# Patient Record
Sex: Male | Born: 1945 | Race: White | Hispanic: No | Marital: Single | Smoking: Never smoker
Health system: Southern US, Community
[De-identification: ages and names within clinical notes are randomized; demographics above are authoritative.]

## PROBLEM LIST (undated history)

## (undated) DIAGNOSIS — A048 Other specified bacterial intestinal infections: Secondary | ICD-10-CM

## (undated) DIAGNOSIS — I10 Essential (primary) hypertension: Secondary | ICD-10-CM

## (undated) DIAGNOSIS — M810 Age-related osteoporosis without current pathological fracture: Secondary | ICD-10-CM

## (undated) DIAGNOSIS — K2270 Barrett's esophagus without dysplasia: Secondary | ICD-10-CM

## (undated) DIAGNOSIS — G8929 Other chronic pain: Secondary | ICD-10-CM

## (undated) DIAGNOSIS — R109 Unspecified abdominal pain: Secondary | ICD-10-CM

## (undated) DIAGNOSIS — K449 Diaphragmatic hernia without obstruction or gangrene: Secondary | ICD-10-CM

## (undated) DIAGNOSIS — K219 Gastro-esophageal reflux disease without esophagitis: Secondary | ICD-10-CM

## (undated) DIAGNOSIS — N200 Calculus of kidney: Secondary | ICD-10-CM

## (undated) DIAGNOSIS — I639 Cerebral infarction, unspecified: Secondary | ICD-10-CM

## (undated) DIAGNOSIS — C159 Malignant neoplasm of esophagus, unspecified: Secondary | ICD-10-CM

## (undated) HISTORY — DX: Gastro-esophageal reflux disease without esophagitis: K21.9

## (undated) HISTORY — DX: Other specified bacterial intestinal infections: A04.8

## (undated) HISTORY — PX: COLONOSCOPY: SHX174

## (undated) HISTORY — DX: Cerebral infarction, unspecified: I63.9

## (undated) HISTORY — DX: Essential (primary) hypertension: I10

## (undated) HISTORY — DX: Malignant neoplasm of esophagus, unspecified: C15.9

## (undated) HISTORY — DX: Age-related osteoporosis without current pathological fracture: M81.0

## (undated) HISTORY — DX: Diaphragmatic hernia without obstruction or gangrene: K44.9

## (undated) HISTORY — DX: Barrett's esophagus without dysplasia: K22.70

## (undated) HISTORY — PX: TONSILLECTOMY: SUR1361

## (undated) HISTORY — PX: CHOLECYSTECTOMY: SHX55

## (undated) HISTORY — DX: Calculus of kidney: N20.0

---

## 2014-04-13 ENCOUNTER — Encounter: Payer: Self-pay | Admitting: Internal Medicine

## 2014-04-15 ENCOUNTER — Encounter: Payer: Self-pay | Admitting: *Deleted

## 2014-04-18 ENCOUNTER — Ambulatory Visit (INDEPENDENT_AMBULATORY_CARE_PROVIDER_SITE_OTHER): Payer: Medicare Other | Admitting: Internal Medicine

## 2014-04-18 ENCOUNTER — Encounter: Payer: Self-pay | Admitting: Internal Medicine

## 2014-04-18 ENCOUNTER — Other Ambulatory Visit (INDEPENDENT_AMBULATORY_CARE_PROVIDER_SITE_OTHER): Payer: Medicare Other

## 2014-04-18 VITALS — BP 136/78 | HR 76 | Ht 64.0 in | Wt 158.4 lb

## 2014-04-18 DIAGNOSIS — R1012 Left upper quadrant pain: Secondary | ICD-10-CM

## 2014-04-18 DIAGNOSIS — R1013 Epigastric pain: Secondary | ICD-10-CM

## 2014-04-18 DIAGNOSIS — K5909 Other constipation: Secondary | ICD-10-CM

## 2014-04-18 LAB — COMPREHENSIVE METABOLIC PANEL
ALT: 21 U/L (ref 0–53)
AST: 19 U/L (ref 0–37)
Albumin: 4.4 g/dL (ref 3.5–5.2)
Alkaline Phosphatase: 96 U/L (ref 39–117)
BILIRUBIN TOTAL: 0.4 mg/dL (ref 0.2–1.2)
BUN: 14 mg/dL (ref 6–23)
CO2: 30 meq/L (ref 19–32)
Calcium: 9.3 mg/dL (ref 8.4–10.5)
Chloride: 104 mEq/L (ref 96–112)
Creatinine, Ser: 0.97 mg/dL (ref 0.40–1.50)
GFR: 81.65 mL/min (ref >60.00)
Glucose, Bld: 108 mg/dL — ABNORMAL HIGH (ref 70–99)
Potassium: 4.8 mEq/L (ref 3.5–5.1)
SODIUM: 139 meq/L (ref 135–145)
TOTAL PROTEIN: 6.9 g/dL (ref 6.0–8.3)

## 2014-04-18 LAB — CBC WITH DIFFERENTIAL/PLATELET
Basophils Absolute: 0 10*3/uL (ref 0.0–0.1)
Basophils Relative: 0.2 % (ref 0.0–3.0)
EOS ABS: 0.1 10*3/uL (ref 0.0–0.7)
EOS PCT: 1.4 % (ref 0.0–5.0)
HCT: 42.8 % (ref 39.0–52.0)
Hemoglobin: 14.3 g/dL (ref 13.0–17.0)
LYMPHS ABS: 1.4 10*3/uL (ref 0.7–4.0)
LYMPHS PCT: 23.9 % (ref 12.0–46.0)
MCHC: 33.5 g/dL (ref 30.0–36.0)
MCV: 83.3 fl (ref 78.0–100.0)
MONO ABS: 0.5 10*3/uL (ref 0.1–1.0)
Monocytes Relative: 8.2 % (ref 3.0–12.0)
Neutro Abs: 3.8 10*3/uL (ref 1.4–7.7)
Neutrophils Relative %: 66.3 % (ref 43.0–77.0)
PLATELETS: 218 10*3/uL (ref 150.0–400.0)
RBC: 5.13 Mil/uL (ref 4.22–5.81)
RDW: 13.8 % (ref 11.5–15.5)
WBC: 5.7 10*3/uL (ref 4.0–10.5)

## 2014-04-18 LAB — IGA: IgA: 154 mg/dL (ref 68–378)

## 2014-04-18 LAB — TSH: TSH: 2.39 u[IU]/mL (ref 0.35–4.50)

## 2014-04-18 LAB — LIPASE: LIPASE: 20 U/L (ref 11.0–59.0)

## 2014-04-18 MED ORDER — LINACLOTIDE 290 MCG PO CAPS: 290.0000 ug | ORAL_CAPSULE | Freq: Every day | ORAL | Status: AC

## 2014-04-18 NOTE — Progress Notes (Signed)
Patient ID: Douglas Odonnell, male   DOB: Aug 24, 1945, 69 y.o.   MRN: [id number] HPI: Douglas Odonnell is a 69 year old male with past medical history of GERD, hypertension, osteoporosis and kidney stones who is seen in consultation at the request of Dr. Melina Copa to evaluate chronic epigastric and left upper quadrant pain. He is here today with his wife. He reports he developed fairly acute upper and left upper abdominal pain a little over 2 years ago. He reports this pain is a constant pain which he describes as a "pressure or burning or like a fist". This gets worse with certain foods like carbonated sodas and lettuce. Better with pain medication. Associated with bloating. Some mild nausea but no vomiting. Had cholecystitis with cholecystectomy in April 2014 but this did not affect the pain in any way. He seems to remember having severe heartburn associated with this pressure type pain. The heartburn went away with PPI but the pain never resolved. He reports a good appetite with no recent weight loss. No early satiety. No dysphagia or odynophagia. He does report issues with constipation associated with his oxycodone. He is using oxycodone every 4 hours on a daily basis for this pain. Without the medication he says the pain is intense. He previously tried lubiprostone but is now taking Linzess 145 g daily. With this he is still having bowel movements every 3-4 days. He denies blood in his stool or melena. Without laxatives he was going less than once per week.  Prior upper endoscopy and colonoscopy performed for similar symptoms in May 2014.  Past Medical History  Diagnosis Date  . Hypertension   . GERD (gastroesophageal reflux disease)   . Senile osteoporosis   . Hiatal hernia   . Renal calculus     Past Surgical History  Procedure Laterality Date  . Tonsillectomy    . Cholecystectomy      No outpatient prescriptions prior to visit.   No facility-administered medications prior to visit.    No  Known Allergies  Family History  Problem Relation Age of Onset  . Diabetes Father   . Heart disease Father   . Prostate cancer Father   . Diabetes Mother   . Kidney failure Mother   . Gout Mother   . Diabetes Sister     twin    History  Substance Use Topics  . Smoking status: Never Smoker   . Smokeless tobacco: Never Used  . Alcohol Use: No    ROS: As per history of present illness, otherwise negative  BP 136/78 mmHg  Pulse 76  Ht 5\' 4"  (1.626 m)  Wt 158 lb 6.4 oz (71.85 kg)  BMI 27.18 kg/m2 Constitutional: Well-developed and well-nourished. No distress. HEENT: Normocephalic and atraumatic. Oropharynx is clear and moist. No oropharyngeal exudate. Conjunctivae are normal.  No scleral icterus. Upper dentures Neck: Neck supple. Trachea midline. Cardiovascular: Normal rate, regular rhythm and intact distal pulses. No M/R/G Pulmonary/chest: Effort normal and breath sounds normal. No wheezing, rales or rhonchi. Abdominal: Soft, epigastric tenderness without rebound or guarding, nondistended. Bowel sounds active throughout. There are no masses palpable. No hepatosplenomegaly. Extremities: no clubbing, cyanosis, or edema Lymphadenopathy: No cervical adenopathy noted. Neurological: Alert and oriented to person place and time. Skin: Skin is warm and dry. No rashes noted. Psychiatric: Normal mood and affect. Behavior is normal.  Upper endoscopy - 08/01/2012 -- moderate bile gastritis Colonoscopy - 08/18/2012 -- normal colonoscopy, follow-up recommended in 10 years for screening  CT abdomen and pelvis with contrast --  04/07/2014 -- no acute findings in the abdomen or pelvis, small hiatal hernia, 5 mm nonobstructing right renal calculus  ASSESSMENT/PLAN: 69 year old male with past medical history of GERD, hypertension, osteoporosis and kidney stones who is seen in consultation at the request of Dr. Melina Copa to evaluate chronic epigastric and left upper quadrant pain.   1.  Epigastric/LUQ pain -- this pain is chronic at this point. Previous evaluation has been unremarkable including recent CT scan. He is taking chronic narcotics for this pain, which is definitely exacerbating constipation. It also may be causing an element of gastroparesis. There was evidence of "bile gastritis" at time of endoscopy but I do not see biopsies. I recommended repeat upper endoscopy given persistent and even worsening of this epigastric and left upper quadrant pain. Rule out ulcers, rule out H. pylori. He will continue Nexium 40 mg daily. Check CBC, CMP, celiac panel, lipase and TSH. If endoscopy completely unrevealing, could consider other treatment for chronic upper abdominal pain such as with gabapentin or Lyrica. Either of those 2 medications would likely be preferable to chronic narcotics  2. Narcotic-induced constipation -- increase Linzess to 290 g daily  Cc referring provider, Dr. Melina Copa

## 2014-04-18 NOTE — Patient Instructions (Signed)
You have been scheduled for an endoscopy. Please follow written instructions given to you at your visit today. If you use inhalers (even only as needed), please bring them with you on the day of your procedure. Your physician has requested that you go to www.startemmi.com and enter the access code given to you at your visit today. This web site gives a general overview about your procedure. However, you should still follow specific instructions given to you by our office regarding your preparation for the procedure.  Your physician has requested that you go to the basement for the following lab work before leaving today: CBC, CMP, IgA, TtG, lipase, TSH  We have sent the following medications to your pharmacy for you to pick up at your convenience: Linzess 290 mcg daily  CC:Dr Melina Copa

## 2014-04-19 ENCOUNTER — Encounter: Payer: Self-pay | Admitting: Internal Medicine

## 2014-04-19 ENCOUNTER — Ambulatory Visit (AMBULATORY_SURGERY_CENTER): Payer: Medicare Other | Admitting: Internal Medicine

## 2014-04-19 VITALS — BP 135/90 | HR 62 | Temp 98.1°F | Resp 29 | Ht 64.0 in | Wt 158.0 lb

## 2014-04-19 DIAGNOSIS — K229 Disease of esophagus, unspecified: Secondary | ICD-10-CM | POA: Diagnosis not present

## 2014-04-19 DIAGNOSIS — K317 Polyp of stomach and duodenum: Secondary | ICD-10-CM

## 2014-04-19 DIAGNOSIS — R1012 Left upper quadrant pain: Secondary | ICD-10-CM | POA: Diagnosis not present

## 2014-04-19 DIAGNOSIS — G8929 Other chronic pain: Secondary | ICD-10-CM

## 2014-04-19 DIAGNOSIS — B9681 Helicobacter pylori [H. pylori] as the cause of diseases classified elsewhere: Secondary | ICD-10-CM

## 2014-04-19 DIAGNOSIS — K2990 Gastroduodenitis, unspecified, without bleeding: Secondary | ICD-10-CM

## 2014-04-19 DIAGNOSIS — R1013 Epigastric pain: Secondary | ICD-10-CM | POA: Diagnosis not present

## 2014-04-19 DIAGNOSIS — K2270 Barrett's esophagus without dysplasia: Secondary | ICD-10-CM | POA: Diagnosis not present

## 2014-04-19 DIAGNOSIS — K2970 Gastritis, unspecified, without bleeding: Secondary | ICD-10-CM

## 2014-04-19 DIAGNOSIS — K2950 Unspecified chronic gastritis without bleeding: Secondary | ICD-10-CM

## 2014-04-19 LAB — TISSUE TRANSGLUTAMINASE, IGA: TISSUE TRANSGLUTAMINASE AB, IGA: 1 U/mL (ref <4)

## 2014-04-19 MED ORDER — SUCRALFATE 1 GM/10ML PO SUSP: 1.0000 g | Freq: Four times a day (QID) | ORAL | Status: DC

## 2014-04-19 MED ORDER — SODIUM CHLORIDE 0.9 % IV SOLN: 500.0000 mL | INTRAVENOUS | Status: DC

## 2014-04-19 NOTE — Op Note (Signed)
[facility]  [address] [place], [address]   ENDOSCOPY PROCEDURE REPORT  PATIENT: Douglas, Odonnell  MR#: [id number] BIRTHDATE: 19-Aug-1945 , 84  yrs. old GENDER: male ENDOSCOPIST: Jerene Bears, MD REFERRED BY:  Octavio Graves PROCEDURE DATE:  04/19/2014 PROCEDURE:  EGD w/ biopsy ASA CLASS:     Class II INDICATIONS:  epigastric pain and abdominal pain in upper left quadrant. MEDICATIONS: Monitored anesthesia care, Propofol 170 mg IV, and lidocaine 40 mg IV TOPICAL ANESTHETIC: none  DESCRIPTION OF PROCEDURE: After the risks benefits and alternatives of the procedure were thoroughly explained, informed consent was obtained.  The LB [id number] endoscope was introduced through the mouth and advanced to the second portion of the duodenum , Without limitations.  The instrument was slowly withdrawn as the mucosa was fully examined.    ESOPHAGUS: There was a 8cm segment of suspected Barrett's esophagus found 25 cm from the incisors extending to the top the gastric folds at 33 cm..  Multiple biopsies were performed using cold forceps.  Sample sent for histology.   A single 54mm nodule arising from within the Barrett's segment was located 27 cm from the incisors.  Multiple biopsies was performed using cold forceps and placed in a separate jar.  STOMACH: A 3 cm hiatal hernia was noted.   Atrophic-appearing gastritis (inflammation) was found in the gastric body and gastric antrum.  Multiple biopsies were performed using cold forceps. Multiple sessile polyps ranging between 3-16mm in size were found in the gastric antrum and gastric body.  Multiple sampling biopsies were performed using cold forceps.  DUODENUM: The duodenal mucosa showed no abnormalities in the bulb and 2nd part of the duodenum.  Retroflexed views revealed a hiatal hernia.     The scope was then withdrawn from the patient and the procedure completed.  COMPLICATIONS: There were no immediate  complications.  ENDOSCOPIC IMPRESSION: 1.   There was a 8cm segment of suspected Barrett's esophagus found 25 cm from the incisors; multiple biopsies were performed 2.   45mm nodule was located 27 cm from the incisors; multiple biopsies was performed 3.   3 cm hiatal hernia 4.   Atrophic gastritis (inflammation) was found in the gastric body and gastric antrum; multiple biopsies were performed 5.   Multiple sessile polyps ranging between 3-67mm in size were found in the gastric antrum and gastric body; multiple sampling biopsies were performed 6.   The duodenal mucosa showed no abnormalities in the bulb and 2nd part of the duodenum  RECOMMENDATIONS: 1.  Await pathology results 2.  Follow-up of helicobacter pylori status, treat if indicated 3.  Continue taking your PPI (antiacid medicine) once daily.  It is best to be taken 20-30 minutes prior to breakfast meal. 4.  Add sucralfate liquid 1 g before meals and at bedtime  eSigned:  Jerene Bears, MD 04/19/2014 11:55 AM    CC: the patient, Octavio Graves, DO  PATIENT NAME:  Douglas, Odonnell MR#: [id number]

## 2014-04-19 NOTE — Progress Notes (Signed)
Stable to RR 

## 2014-04-19 NOTE — Progress Notes (Signed)
Called to room to assist during endoscopic procedure.  Patient ID and intended procedure confirmed with present staff. Received instructions for my participation in the procedure from the performing physician.  

## 2014-04-19 NOTE — Patient Instructions (Addendum)
Impressions/recommendations:  Suspected Barrett's esophagus (handout given) Hiatal hernia (handout given) Gastritis (handout given) Polyps  Continue taking PPI (Nexium) daily. It is best taken 20-30 minutes prior to breakfast meal. Add sucralfate liquid 1 G before meals and at bedtime.  YOU HAD AN ENDOSCOPIC PROCEDURE TODAY AT [facility]: Refer to the procedure report that was given to you for any specific questions about what was found during the examination.  If the procedure report does not answer your questions, please call your gastroenterologist to clarify.  If you requested that your care partner not be given the details of your procedure findings, then the procedure report has been included in a sealed envelope for you to review at your convenience later.  YOU SHOULD EXPECT: Some feelings of bloating in the abdomen. Passage of more gas than usual.  Walking can help get rid of the air that was put into your GI tract during the procedure and reduce the bloating. If you had a lower endoscopy (such as a colonoscopy or flexible sigmoidoscopy) you may notice spotting of blood in your stool or on the toilet paper. If you underwent a bowel prep for your procedure, then you may not have a normal bowel movement for a few days.  DIET: Your first meal following the procedure should be a light meal and then it is ok to progress to your normal diet.  A half-sandwich or bowl of soup is an example of a good first meal.  Heavy or fried foods are harder to digest and may make you feel nauseous or bloated.  Likewise meals heavy in dairy and vegetables can cause extra gas to form and this can also increase the bloating.  Drink plenty of fluids but you should avoid alcoholic beverages for 24 hours.  ACTIVITY: Your care partner should take you home directly after the procedure.  You should plan to take it easy, moving slowly for the rest of the day.  You can resume normal activity the day after  the procedure however you should NOT DRIVE or use heavy machinery for 24 hours (because of the sedation medicines used during the test).    SYMPTOMS TO REPORT IMMEDIATELY: A gastroenterologist can be reached at any hour.  During normal business hours, 8:30 AM to 5:00 PM Monday through Friday, call [phone].  After hours and on weekends, please call the GI answering service at [phone] who will take a message and have the physician on call contact you.   Following upper endoscopy (EGD)  Vomiting of blood or coffee ground material  New chest pain or pain under the shoulder blades  Painful or persistently difficult swallowing  New shortness of breath  Fever of 100F or higher  Black, tarry-looking stools  FOLLOW UP: If any biopsies were taken you will be contacted by phone or by letter within the next 1-3 weeks.  Call your gastroenterologist if you have not heard about the biopsies in 3 weeks.  Our staff will call the home number listed on your records the next business day following your procedure to check on you and address any questions or concerns that you may have at that time regarding the information given to you following your procedure. This is a courtesy call and so if there is no answer at the home number and we have not heard from you through the emergency physician on call, we will assume that you have returned to your regular daily activities without incident.  SIGNATURES/CONFIDENTIALITY: You  and/or your care partner have signed paperwork which will be entered into your electronic medical record.  These signatures attest to the fact that that the information above on your After Visit Summary has been reviewed and is understood.  Full responsibility of the confidentiality of this discharge information lies with you and/or your care-partner.

## 2014-04-19 NOTE — Progress Notes (Signed)
No problems noted in the recovery room. maw 

## 2014-04-20 ENCOUNTER — Telehealth: Payer: Self-pay | Admitting: *Deleted

## 2014-04-20 NOTE — Telephone Encounter (Signed)
  Follow up Call-  Call back number 04/19/2014  Post procedure Call Back phone  # [phone]  Permission to leave phone message Yes     Patient questions:  Do you have a fever, pain , or abdominal swelling? No. Pain Score  0 *  Have you tolerated food without any problems? Yes.    Have you been able to return to your normal activities? Yes.    Do you have any questions about your discharge instructions: Diet   No. Medications  No. Follow up visit  No.  Do you have questions or concerns about your Care? No.  Actions: * If pain score is 4 or above: No action needed, pain <4.

## 2014-04-26 ENCOUNTER — Telehealth: Payer: Self-pay | Admitting: Internal Medicine

## 2014-04-26 NOTE — Telephone Encounter (Signed)
Called to discuss pathology results and plan of care No answer, left message for patient to call my office. I will try him back tomorrow if I don't hear from him sooner

## 2014-04-27 ENCOUNTER — Other Ambulatory Visit: Payer: Self-pay

## 2014-04-27 ENCOUNTER — Telehealth: Payer: Self-pay | Admitting: Internal Medicine

## 2014-04-27 DIAGNOSIS — C159 Malignant neoplasm of esophagus, unspecified: Secondary | ICD-10-CM

## 2014-04-27 MED ORDER — AMOXICILL-CLARITHRO-LANSOPRAZ PO MISC: Freq: Two times a day (BID) | ORAL | Status: DC

## 2014-04-27 MED ORDER — SUCRALFATE 1 G PO TABS: 1.0000 g | ORAL_TABLET | Freq: Three times a day (TID) | ORAL | Status: DC

## 2014-04-27 NOTE — Telephone Encounter (Signed)
Spoke with the patient's sister, Rosaria Ferries regarding pathology results from upper endoscopy 1. 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 Barrett's esophagus without dysplasia and other biopsies 3. H. pylori gastritis  --CT scan of the abdomen and pelvis was done recently, 04/07/2014 with no evidence of metastatic disease --Need CT scan of the chest at this time --[facility] for Prevpac for H. pylori treatment --Liquid Carafate too expensive, please change to Carafate tablets 1 g before meals and at bedtime and instruct patient on how to make this into a slurry --Referral to Dr. Gayleen Orem at [facility] for consideration of esophageal submucosal resection of esophageal nodule/adenocarcinoma, expect EUS to be performed at the same time. Referral is ASAP. Dr. Okey Dupre will need copies of chest CT was performed, abdominal CT scan already scanned into our records  Time provided for questions and answers, they thanked me for the call. The patient's sister, Enid Derry, will share all results with the patient. This is the patient's preference

## 2014-04-27 NOTE — Telephone Encounter (Signed)
Spoke with pts sister and gave her the following information. Script for prevpac and carafate tablets sent to pharmacy and sister instructed on how to use tablets as slurry. Pt scheduled for CT of Chest at [facility] CT 05/04/14@8 :30am, pt to arrive there at 8:15am and be NPO after 6:15am. Referral faxed to Dr. Gayleen Orem at [facility]. Sister knows to expect a call back when appt confirmed with [facility].

## 2014-05-04 ENCOUNTER — Ambulatory Visit (INDEPENDENT_AMBULATORY_CARE_PROVIDER_SITE_OTHER)
Admission: RE | Admit: 2014-05-04 | Discharge: 2014-05-04 | Disposition: A | Discharge status: Home / Self Care | Payer: Medicare Other | Source: Ambulatory Visit | Attending: Internal Medicine | Admitting: Internal Medicine

## 2014-05-04 DIAGNOSIS — C159 Malignant neoplasm of esophagus, unspecified: Secondary | ICD-10-CM

## 2014-05-04 MED ORDER — IOHEXOL 300 MG/ML  SOLN
80.0000 mL | Freq: Once | INTRAMUSCULAR | Status: AC | PRN
  Administered 2014-05-04: 80 mL via INTRAVENOUS

## 2014-05-09 ENCOUNTER — Telehealth: Payer: Self-pay | Admitting: Internal Medicine

## 2014-05-09 MED ORDER — ONDANSETRON HCL 4 MG PO TABS: 4.0000 mg | ORAL_TABLET | Freq: Four times a day (QID) | ORAL | Status: DC | PRN

## 2014-05-09 NOTE — Telephone Encounter (Signed)
Per Dr. Hilarie Fredrickson pt is good to see Dr. Adria Devon. Pt can have Zofran 4mg  1-2 every 6-8 hours prn nausea. Script sent to pharmacy.

## 2014-05-09 NOTE — Telephone Encounter (Signed)
Sister says pt has been scheduled to see Dr. Adria Devon this Thursday and to have procedure that day also. She was concerned because they were thinking they would see Dr. Lysle Rubens. States he is also having lots of nausea, not actually getting sick. Please advise.

## 2014-05-09 NOTE — Telephone Encounter (Signed)
[facility] called back and states that Dr. Lysle Rubens reviewed the records and does not feel that the pt needs an EUS. He is having the pt seen by Dr. Adria Devon and he will do the barretts procedure and either mucosal resection or RFA ablation. Still waiting on exact appointment date.

## 2014-05-09 NOTE — Telephone Encounter (Signed)
Dr. Adria Devon works closely with Dr. Lysle Rubens and is also an expert in the esophagus.  He will be in very good and capable hands with Dr. Adria Devon Zofran 4 mg 1-2 tablets every 6-8 hours as needed for nausea. Do not exceed 24 mg per 24 hours

## 2014-05-12 NOTE — Telephone Encounter (Signed)
[place] for promethazine, but this can make him very sleepy and he should not drive when using this medication

## 2014-05-12 NOTE — Telephone Encounter (Signed)
Patient's pharmacy has sent a fax stating that zofran is not covered by Medicare Part D and will cost patient $90.45. However, promethazine 25 mg #30 tablets would cost $10.00. Please advise... May patient have promethazine instead or do you prefer to stick with the zofran?

## 2014-05-12 NOTE — Telephone Encounter (Signed)
I have spoken to patient's sister (ok per HIPAA) to advise of Dr Vena Rua recommendations. She verbalizes understanding. Promethazine sent to pharmacy.

## 2014-05-12 NOTE — Addendum Note (Signed)
Addended by: Larina Bras on: 05/12/2014 02:17 PM   Modules accepted: Orders, Medications

## 2014-05-13 MED ORDER — PROMETHAZINE HCL 25 MG PO TABS: 25.0000 mg | ORAL_TABLET | Freq: Four times a day (QID) | ORAL | Status: DC | PRN

## 2014-05-13 NOTE — Addendum Note (Signed)
Addended by: Larina Bras on: 05/13/2014 04:49 PM   Modules accepted: Orders

## 2014-06-07 ENCOUNTER — Encounter: Payer: Self-pay | Admitting: Internal Medicine

## 2014-06-09 ENCOUNTER — Telehealth: Payer: Self-pay | Admitting: Internal Medicine

## 2014-06-09 NOTE — Telephone Encounter (Signed)
Pt finished taking prevpac on 05/14/14. Pts sister states that while he was taking the meds he felt better and was able to reduce the pain meds that he was taking. States that now his pain is worse in his stomach and he has had to increase the pain meds. Pts sister wants to know if pt should take more antibiotics or if he needs to see Dr. Hilarie Fredrickson. Please advise.

## 2014-06-12 NOTE — Telephone Encounter (Signed)
Would ensure PPI is BID, can use carafate 1g TIDAC and HS Would also recommend H pylori breath testing off PPI (off for at least 7 days) to confirm eradication because not Prevpak, while good is not 100% effective Then office followup

## 2014-06-14 ENCOUNTER — Other Ambulatory Visit: Payer: Self-pay

## 2014-06-14 MED ORDER — SUCRALFATE 1 G PO TABS: 1.0000 g | ORAL_TABLET | Freq: Three times a day (TID) | ORAL | Status: DC

## 2014-06-14 NOTE — Telephone Encounter (Signed)
Spoke with pts sister and she is aware, script sent to pharmacy for carafate. H pylori orders in, will call back to Endo tomorrow to schedule. Pt scheduled to see Dr. Hilarie Fredrickson 08/09/14@2 :45pm. Will call pts sister back tomorrow when breath test scheduled.

## 2014-06-15 NOTE — Telephone Encounter (Signed)
Pt scheduled for h pylori breath test at [facility] 06/28/14@7 :45am, pt to arrive there at 7:30am. Pt to be NPO after midnight and be off of PPI's for 7 days prior to test. Attempted to call pts sister and receive message that voice mail has not been set up yet. Will try again.  Spoke with pts sister and she is aware of all appts and instructions.

## 2014-06-28 ENCOUNTER — Ambulatory Visit (HOSPITAL_COMMUNITY)
Admission: RE | Admit: 2014-06-28 | Discharge: 2014-06-28 | Disposition: A | Discharge status: Home / Self Care | Payer: Medicare Other | Source: Ambulatory Visit | Attending: Internal Medicine | Admitting: Internal Medicine

## 2014-06-28 ENCOUNTER — Encounter (HOSPITAL_COMMUNITY)
Admission: RE | Disposition: A | Discharge status: Home / Self Care | Payer: Self-pay | Source: Ambulatory Visit | Attending: Internal Medicine

## 2014-06-28 ENCOUNTER — Telehealth: Payer: Self-pay

## 2014-06-28 DIAGNOSIS — I10 Essential (primary) hypertension: Secondary | ICD-10-CM | POA: Diagnosis not present

## 2014-06-28 DIAGNOSIS — R109 Unspecified abdominal pain: Secondary | ICD-10-CM | POA: Insufficient documentation

## 2014-06-28 HISTORY — PX: BREATH TEK H PYLORI: SHX5422

## 2014-06-28 SURGERY — BREATH TEST, FOR HELICOBACTER PYLORI

## 2014-06-28 NOTE — Telephone Encounter (Signed)
Can retreat x 1 to see if symptoms improve

## 2014-06-28 NOTE — Progress Notes (Signed)
   06/28/14 [place]  Referring MD Dr Kelly Splinter  Time of Last PO Intake 2100  Baseline Breath At: 0732  Pranactin Given At: 0732  Post-Dose Breath At: 0747  Sample 1 3.4%  Sample 2 3.1%  Test Negative

## 2014-06-28 NOTE — Telephone Encounter (Signed)
Please let patient and sister know that H pylori test is now negative    He can and should resume PPI   Thanks   [employer] with pts sister and she is aware of results and pt is taking PPI's. She states that he felt better when he was on treatment for h pylori and now that he has finished the meds his pain has returned and he is having to take pain meds again. Any further recommendations? Please advise.

## 2014-06-29 ENCOUNTER — Telehealth: Payer: Self-pay | Admitting: *Deleted

## 2014-06-29 ENCOUNTER — Encounter (HOSPITAL_COMMUNITY): Payer: Self-pay | Admitting: Internal Medicine

## 2014-06-29 MED ORDER — AMOXICILL-CLARITHRO-LANSOPRAZ PO MISC: Freq: Two times a day (BID) | ORAL | Status: DC

## 2014-06-29 NOTE — Telephone Encounter (Signed)
Patient's insurance does not cover Prepac. Please advise.

## 2014-06-29 NOTE — Telephone Encounter (Signed)
Spoke with pts sister and she is aware. Prescription sent to pharmacy.

## 2014-06-29 NOTE — Telephone Encounter (Signed)
He got Prevpac last time but his pharmacy sent me a note today telling me it is not covered on his plan. I will just have them to fill it and have patient call me if he has a problem with the price.

## 2014-06-29 NOTE — Telephone Encounter (Signed)
What did he take previously (he was able to get it several months ago)

## 2014-07-01 NOTE — Telephone Encounter (Signed)
I got a prior auth request for Prevpac sent from patient's pharmacy. I filled out PA form. Insurance approval has come back from [employer] stating "non formulary coverage." Coverage through 04/01/14-06/30/15.

## 2014-07-11 ENCOUNTER — Encounter: Payer: Self-pay | Admitting: *Deleted

## 2014-08-09 ENCOUNTER — Encounter: Payer: Self-pay | Admitting: Internal Medicine

## 2014-08-09 ENCOUNTER — Ambulatory Visit (INDEPENDENT_AMBULATORY_CARE_PROVIDER_SITE_OTHER): Payer: Medicare Other | Admitting: Internal Medicine

## 2014-08-09 VITALS — BP 122/64 | HR 72 | Ht 64.0 in | Wt 147.4 lb

## 2014-08-09 DIAGNOSIS — C159 Malignant neoplasm of esophagus, unspecified: Secondary | ICD-10-CM

## 2014-08-09 DIAGNOSIS — Z8501 Personal history of malignant neoplasm of esophagus: Secondary | ICD-10-CM

## 2014-08-09 DIAGNOSIS — R911 Solitary pulmonary nodule: Secondary | ICD-10-CM | POA: Diagnosis not present

## 2014-08-09 DIAGNOSIS — K2270 Barrett's esophagus without dysplasia: Secondary | ICD-10-CM

## 2014-08-09 DIAGNOSIS — Z8619 Personal history of other infectious and parasitic diseases: Secondary | ICD-10-CM | POA: Insufficient documentation

## 2014-08-09 DIAGNOSIS — K5900 Constipation, unspecified: Secondary | ICD-10-CM | POA: Diagnosis not present

## 2014-08-09 DIAGNOSIS — R1013 Epigastric pain: Secondary | ICD-10-CM

## 2014-08-09 DIAGNOSIS — K5903 Drug induced constipation: Secondary | ICD-10-CM

## 2014-08-09 DIAGNOSIS — G8929 Other chronic pain: Secondary | ICD-10-CM | POA: Insufficient documentation

## 2014-08-09 DIAGNOSIS — T402X5A Adverse effect of other opioids, initial encounter: Secondary | ICD-10-CM | POA: Insufficient documentation

## 2014-08-09 DIAGNOSIS — K5909 Other constipation: Secondary | ICD-10-CM | POA: Diagnosis not present

## 2014-08-09 MED ORDER — AMITRIPTYLINE HCL 25 MG PO TABS: 25.0000 mg | ORAL_TABLET | Freq: Every day | ORAL | Status: DC

## 2014-08-09 NOTE — Progress Notes (Signed)
Subjective:    Patient ID: Douglas Odonnell, male    DOB: 10/14/1945, 69 y.o.   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 He is here today with his sister. He was initially seen in February and after this visit upper endoscopy was repeated. This revealed an 8 cm segment of suspected Barrett's esophagus and a single 62mm nodule at 27 cm from the incisors. This was biopsied and found to be adenocarcinoma. The other biopsies showed Barrett's without dysplasia. In the stomach there was evidence of gastritis biopsied and found to have H. pylori. H. pylori was treated and he was referred to Dr. Adria Devon at [facility] for consideration of MR of his esophageal adenocarcinoma.  On 05/12/2014 he had upper endoscopy with EMR of the esophageal nodule/adenocarcinoma. Then in May 2016 he went back for radiofrequency ablation of his Barrett's esophagus. He has follow-up with Dr. Adria Devon in July for possibly retreatment for Barrett's and also to check the EMR site. Upper endoscopy did not show any evidence of residual polyps in the stomach.  He has been started on Linzess 290 g daily. This is working well for his opiate avoid related constipation. He's taking Nexium 40 mg twice daily and he was told by Dr. Adria Devon the heated to take this for the rest of his life. He is also using Carafate suspension 3-4 times daily.  His chronic left upper quadrant abdominal pain persist. It improved for a short time when he was treated initially for H. pylori. He had a breath test confirming eradication of H. pylori. We retreated antibiotics but the pain did not improve with the second round of antibiotics. He is using oxycodone every 4-5 hours for this chronic pain   Review of Systems As per  history of present illness, otherwise negative  Current Medications, Allergies, Past Medical History, Past Surgical History, Family History and Social History were reviewed in [employer] record.      Objective:   Physical Exam .BP 122/64 mmHg  Pulse 72  Ht 5\' 4"  (1.626 m)  Wt 147 lb 6.4 oz (66.86 kg)  BMI 25.29 kg/m2 Constitutional: Well-developed and well-nourished. No distress. HEENT: Normocephalic and atraumatic. Oropharynx is clear and moist. No oropharyngeal exudate. Conjunctivae are normal.  No scleral icterus. Neck: Neck supple. Trachea midline. Cardiovascular: Normal rate, regular rhythm and intact distal pulses. No M/R/G Pulmonary/chest: Effort normal and breath sounds normal. No wheezing, rales or rhonchi. Abdominal: Soft, left upper quadrant tenderness without rebound or guarding, nondistended. Bowel sounds active throughout. There are no masses palpable. No hepatosplenomegaly. Extremities: no clubbing, cyanosis, or edema Lymphadenopathy: No cervical adenopathy noted. Neurological: Alert and oriented to person place and time. Skin: Skin is warm and dry. No rashes noted. Psychiatric: Normal mood and affect. Behavior is normal.  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   1. Esophageal adenocarcinoma and Barrett's esophagus -- status post EMR of his adenocarcinoma of the esophagus.  Felt to have complete surgical resection. He has follow-up endoscopy with Dr. Adria Devon in July 2016. He will take twice a day Nexium indefinitely under the direction of Dr.  Adria Devon. He is not having heartburn or dysphagia. He did have esophageal pain after radiofrequency ablation but this has resolved.  2. Chronic left-sided epigastric abdominal pain -- no etiology found, on chronic narcotics for this pain. H. pylori has been eradicated and no findings on cross-sectional imaging to explain the pain. We discussed how amitriptyline may help augment this pain and I will start him on 25 mg daily at bedtime. This may also help with sleep. If he benefits consider increasing to 50 mg at bedtime. He will continue oxycodone prescribed by Dr. Melina Copa. Perhaps amitriptyline will allow him to take less oxycodone  3. Lung nodule -- very small, 2 mm, seen on CT in February. Plan repeat CT at 6 months which will be August 2016 to follow-up this nodule  4. Opiate-induced constipation -- benefiting from Linzess, continue 290 g daily  40 minutes spent, greater than 50% with the patient discussing above issues

## 2014-08-09 NOTE — Patient Instructions (Signed)
Continue your Linzess and Nexium  We have sent the following medications to your pharmacy for you to pick up at your convenience:  Amitriptyline  Please follow up with Dr. Henrene Pastor in 4 months.  I will call you regarding your chest ct when it gets closer to August

## 2014-08-18 ENCOUNTER — Encounter: Payer: Self-pay | Admitting: Internal Medicine

## 2014-09-22 ENCOUNTER — Other Ambulatory Visit: Payer: Self-pay

## 2014-09-22 ENCOUNTER — Telehealth: Payer: Self-pay | Admitting: Internal Medicine

## 2014-09-22 DIAGNOSIS — R918 Other nonspecific abnormal finding of lung field: Secondary | ICD-10-CM

## 2014-09-22 DIAGNOSIS — R911 Solitary pulmonary nodule: Secondary | ICD-10-CM

## 2014-09-22 NOTE — Telephone Encounter (Signed)
Pt scheduled for CT of chest at [facility] CT 11/02/14@10 :30am. Pt to have no solid food after 8:30am. Pt to come and have labs prior to CT, Orders in epic. Pt scheduled to see Dr. Hilarie Fredrickson 11/25/14@11am . Pts sister aware of appts.

## 2014-10-14 ENCOUNTER — Encounter: Payer: Self-pay | Admitting: Internal Medicine

## 2014-10-20 ENCOUNTER — Other Ambulatory Visit (INDEPENDENT_AMBULATORY_CARE_PROVIDER_SITE_OTHER): Payer: Medicare Other

## 2014-10-20 DIAGNOSIS — R918 Other nonspecific abnormal finding of lung field: Secondary | ICD-10-CM | POA: Diagnosis not present

## 2014-10-20 LAB — CREATININE, SERUM: Creatinine, Ser: 1.07 mg/dL (ref 0.40–1.50)

## 2014-10-20 LAB — BUN: BUN: 15 mg/dL (ref 6–23)

## 2014-10-25 ENCOUNTER — Encounter: Payer: Self-pay | Admitting: *Deleted

## 2014-11-02 ENCOUNTER — Ambulatory Visit (INDEPENDENT_AMBULATORY_CARE_PROVIDER_SITE_OTHER)
Admission: RE | Admit: 2014-11-02 | Discharge: 2014-11-02 | Disposition: A | Discharge status: Home / Self Care | Payer: Medicare Other | Source: Ambulatory Visit | Attending: Internal Medicine | Admitting: Internal Medicine

## 2014-11-02 DIAGNOSIS — R911 Solitary pulmonary nodule: Secondary | ICD-10-CM

## 2014-11-25 ENCOUNTER — Ambulatory Visit: Payer: Medicare Other | Admitting: Internal Medicine

## 2014-11-25 ENCOUNTER — Telehealth: Payer: Self-pay | Admitting: Internal Medicine

## 2014-11-25 NOTE — Telephone Encounter (Signed)
No charge. 

## 2014-12-26 ENCOUNTER — Encounter: Payer: Self-pay | Admitting: Internal Medicine

## 2015-01-25 ENCOUNTER — Other Ambulatory Visit: Payer: Self-pay | Admitting: Internal Medicine

## 2015-01-25 ENCOUNTER — Telehealth: Payer: Self-pay | Admitting: Internal Medicine

## 2015-01-25 DIAGNOSIS — K317 Polyp of stomach and duodenum: Secondary | ICD-10-CM

## 2015-01-25 DIAGNOSIS — K2990 Gastroduodenitis, unspecified, without bleeding: Secondary | ICD-10-CM

## 2015-01-25 DIAGNOSIS — K229 Disease of esophagus, unspecified: Secondary | ICD-10-CM

## 2015-01-25 DIAGNOSIS — R1012 Left upper quadrant pain: Secondary | ICD-10-CM

## 2015-01-25 DIAGNOSIS — R1013 Epigastric pain: Principal | ICD-10-CM

## 2015-01-25 DIAGNOSIS — K2970 Gastritis, unspecified, without bleeding: Secondary | ICD-10-CM

## 2015-01-25 DIAGNOSIS — G8929 Other chronic pain: Secondary | ICD-10-CM

## 2015-01-25 MED ORDER — SUCRALFATE 1 GM/10ML PO SUSP: 1.0000 g | Freq: Four times a day (QID) | ORAL | Status: DC

## 2015-01-25 MED ORDER — AMITRIPTYLINE HCL 25 MG PO TABS: 25.0000 mg | ORAL_TABLET | Freq: Every day | ORAL | Status: DC

## 2015-01-25 NOTE — Telephone Encounter (Signed)
Rx sent until patient appointment 01/2015.

## 2015-01-26 ENCOUNTER — Telehealth: Payer: Self-pay | Admitting: Internal Medicine

## 2015-01-26 NOTE — Telephone Encounter (Signed)
No other prior authorizations needed. Evon Slack has been advised of this.

## 2015-01-27 NOTE — Telephone Encounter (Signed)
Per [employer], patient's carafate is approved from 10/28/14-01/26/16.

## 2015-02-07 ENCOUNTER — Other Ambulatory Visit (INDEPENDENT_AMBULATORY_CARE_PROVIDER_SITE_OTHER): Payer: Medicare Other

## 2015-02-07 ENCOUNTER — Ambulatory Visit (INDEPENDENT_AMBULATORY_CARE_PROVIDER_SITE_OTHER): Payer: Medicare Other | Admitting: Internal Medicine

## 2015-02-07 ENCOUNTER — Encounter: Payer: Self-pay | Admitting: Internal Medicine

## 2015-02-07 VITALS — BP 138/86 | HR 88 | Ht 64.0 in | Wt 164.4 lb

## 2015-02-07 DIAGNOSIS — R1013 Epigastric pain: Secondary | ICD-10-CM

## 2015-02-07 DIAGNOSIS — K5909 Other constipation: Secondary | ICD-10-CM

## 2015-02-07 DIAGNOSIS — G8929 Other chronic pain: Secondary | ICD-10-CM

## 2015-02-07 DIAGNOSIS — R300 Dysuria: Secondary | ICD-10-CM | POA: Diagnosis not present

## 2015-02-07 DIAGNOSIS — C159 Malignant neoplasm of esophagus, unspecified: Secondary | ICD-10-CM

## 2015-02-07 MED ORDER — AMITRIPTYLINE HCL 50 MG PO TABS: 50.0000 mg | ORAL_TABLET | Freq: Every day | ORAL | Status: DC

## 2015-02-07 NOTE — Progress Notes (Signed)
Subjective:    Patient ID: Douglas Odonnell, male    DOB: 26-Feb-1946, 69 y.o.   MRN: [id number]  HPI Douglas Odonnell is a 69 year old male with past medical history of distal esophageal adenocarcinoma arising in Barrett's esophagus treated endoscopically at [facility] by Dr. Adria Devon, GERD, chronic left-sided epigastric abdominal pain, hypertension and kidney stones who is here for follow-up. He was last seen in May 2016. He is here today with his sister.   He very recently had repeat upper endoscopy performed on 01/26/2015 by Dr. Adria Devon. This revealed irregular Z line which was ablated using APC. He reports he tolerated this procedure well and has had no esophageal or chest pain since this procedure. He has follow-up endoscopy scheduled for January for additional biopsies to exclude Barrett's and also residual adenocarcinoma.  He had a follow-up chest CT scan in August to evaluate lung nodule and this showed stability.   he continues to have chronic left upper quadrant abdominal pain. This is unchanged. Doesn't relate to eating as before. Improved for a short period when he was initially treated for H. pylori, but return. H. pylori eradication have been documented. The stomach looked endoscopically normal at very recent upper endoscopy. He does have a small hiatal hernia. He is taking oxycodone daily for his chronic abdominal pain. We added amitriptyline at last visit and this help with sleep but not with pain  He has narcotic induced constipation and is using Linzess 290 g daily. He has a bowel movement every 3-4 days which he is satisfied with. Recently he's had some mild urinary frequency and mild dysuria.   Review of Systems As per history of present illness, otherwise negative  Current Medications, Allergies, Past Medical History, Past Surgical History, Family History and Social History were reviewed in [employer] record.     Objective:   Physical Exam BP 138/86 mmHg   Pulse 88  Ht 5\' 4"  (1.626 m)  Wt 164 lb 6 oz (74.56 kg)  BMI 28.20 kg/m2 Constitutional: Well-developed and well-nourished. No distress. HEENT: Normocephalic and atraumatic. Oropharynx is clear and moist. No oropharyngeal exudate. Conjunctivae are normal.  No scleral icterus. Neck: Neck supple. Trachea midline. Cardiovascular: Normal rate, regular rhythm and intact distal pulses. No M/R/G Pulmonary/chest: Effort normal and breath sounds normal. No wheezing, rales or rhonchi. Abdominal: Soft, slightly left of midline epigastric tenderness without rebound or guarding, nondistended. Bowel sounds active throughout. There are no masses palpable.  Extremities: no clubbing, cyanosis, or edema Neurological: Alert and oriented to person place and time. Skin: Skin is warm and dry.  Psychiatric: Normal mood and affect. Behavior is normal.  CBC    Component Value Date/Time   WBC 5.7 04/18/2014 0940   RBC 5.13 04/18/2014 0940   HGB 14.3 04/18/2014 0940   HCT 42.8 04/18/2014 0940   PLT 218.0 04/18/2014 0940   MCV 83.3 04/18/2014 0940   MCHC 33.5 04/18/2014 0940   RDW 13.8 04/18/2014 0940   LYMPHSABS 1.4 04/18/2014 0940   MONOABS 0.5 04/18/2014 0940   EOSABS 0.1 04/18/2014 0940   BASOSABS 0.0 04/18/2014 0940    CMP     Component Value Date/Time   NA 139 04/18/2014 0940   K 4.8 04/18/2014 0940   CL 104 04/18/2014 0940   CO2 30 04/18/2014 0940   GLUCOSE 108* 04/18/2014 0940   BUN 15 10/20/2014 0850   CREATININE 1.07 10/20/2014 0850   CALCIUM 9.3 04/18/2014 0940   PROT 6.9 04/18/2014 0940   ALBUMIN 4.4  04/18/2014 0940   AST 19 04/18/2014 0940   ALT 21 04/18/2014 0940   ALKPHOS 96 04/18/2014 0940   BILITOT 0.4 04/18/2014 0940      CLINICAL DATA:  Evaluate lung nodule, esophageal cancer.   EXAM: CT CHEST WITH CONTRAST   TECHNIQUE: Multidetector CT imaging of the chest was performed during intravenous contrast administration.   CONTRAST:  80 cc Omnipaque 300.   COMPARISON:   05/04/2014.   FINDINGS: Mediastinum/Nodes: No pathologically enlarged mediastinal, hilar or axillary lymph nodes. Heart size normal. No pericardial effusion. Distal esophageal wall thickening with suspected luminal narrowing, given mid esophageal dilatation. Periesophageal lymph nodes are sub cm in short axis size.   Lungs/Pleura: 3 mm right lower lobe nodule in the right lower lobe (series 3, image 27) is unchanged from 05/04/2014. Lungs are otherwise clear. No pleural fluid. Airway is unremarkable.   Upper abdomen: Liver may be slightly decreased in attenuation diffusely. Low-attenuation lesion in the left hepatic lobe measures 1.5 cm, unchanged from 09/02/2013. Cholecystectomy. Adrenal glands are unremarkable. 1.0 cm low-attenuation lesion in the upper pole right kidney is unchanged 09/02/2013, favoring a cyst. Visualized portions of the kidneys, spleen and pancreas are otherwise unremarkable. Small hiatal hernia. No upper abdominal adenopathy.   Musculoskeletal: No worrisome lytic or sclerotic lesions. Degenerative changes are seen in the spine.   IMPRESSION: 1. Distal esophageal wall thickening, as on prior exams, in this patient with a diagnosis of esophageal carcinoma. Suspect associated luminal narrowing, as there is dilatation of the mid esophagus. 2. Stable 3 mm right lower lobe nodule. 3. Liver may be fatty.     Electronically Signed   By: Lorin Picket M.D.   On: 11/02/2014 11:58       Assessment & Plan:  69 year old male with past medical history of distal esophageal adenocarcinoma arising in Barrett's esophagus treated endoscopically at [facility] by Dr. Adria Devon, GERD, chronic left-sided epigastric abdominal pain, hypertension and kidney stones who is here for follow-up  1. Esophageal adenocarcinoma and Barrett's esophagus -- following closely with Dr. Adria Devon at [facility]. Recent APC to the distal esophagus. Follow-up endoscopy planned in January 2017. He remains on PPI  therapy  2. Chronic epigastric left-sided abdominal pain -- no source for this pain despite thorough workup. He is using narcotic pain medications for this chronic pain under direction of his primary care doctor. I will increase amitriptyline to 50 mg daily at bedtime to try to augment this pain.  3. Lung nodule -- stable by recent CT scan. Felt to be benign  4. Opioid-induced constipation -- continue Linzess 290 g daily. This is working for his constipation at present.  5. Dysuria -- UA and culture  25 minutes spent with the patient today. Greater than 50% was spent in counseling and coordination of care with the patient

## 2015-02-07 NOTE — Patient Instructions (Signed)
Your physician has requested that you go to the basement for the following lab work before leaving today: Urinalysis and culture  We have sent the following medications to your pharmacy for you to pick up at your convenience: Amitriptyline 50 mg every night (increase from 25 mg)  Continue Amitiza.

## 2015-02-09 LAB — URINALYSIS, ROUTINE W REFLEX MICROSCOPIC
BILIRUBIN URINE: NEGATIVE
HGB URINE DIPSTICK: NEGATIVE
Ketones, ur: NEGATIVE
LEUKOCYTES UA: NEGATIVE
NITRITE: NEGATIVE
PH: 6.5 (ref 5.0–8.0)
RBC / HPF: NONE SEEN (ref 0–?)
Specific Gravity, Urine: 1.015 (ref 1.000–1.030)
TOTAL PROTEIN, URINE-UPE24: NEGATIVE
URINE GLUCOSE: NEGATIVE
Urobilinogen, UA: 1 (ref 0.0–1.0)
WBC, UA: NONE SEEN (ref 0–?)

## 2015-02-10 LAB — URINE CULTURE: Colony Count: 8000

## 2015-02-13 ENCOUNTER — Encounter: Payer: Self-pay | Admitting: Internal Medicine

## 2015-05-05 ENCOUNTER — Other Ambulatory Visit: Payer: Self-pay | Admitting: Internal Medicine

## 2015-08-04 ENCOUNTER — Other Ambulatory Visit: Payer: Self-pay | Admitting: Internal Medicine

## 2017-05-02 ENCOUNTER — Emergency Department (HOSPITAL_COMMUNITY): Payer: Medicare Other

## 2017-05-02 ENCOUNTER — Encounter (HOSPITAL_COMMUNITY): Payer: Self-pay | Admitting: Emergency Medicine

## 2017-05-02 ENCOUNTER — Inpatient Hospital Stay (HOSPITAL_COMMUNITY)
Admission: EM | Admit: 2017-05-02 | Discharge: 2017-05-04 | DRG: 065 | Disposition: A | Discharge status: Home / Self Care | Payer: Medicare Other | Attending: Internal Medicine | Admitting: Internal Medicine

## 2017-05-02 ENCOUNTER — Other Ambulatory Visit: Payer: Self-pay

## 2017-05-02 DIAGNOSIS — W1830XA Fall on same level, unspecified, initial encounter: Secondary | ICD-10-CM | POA: Diagnosis not present

## 2017-05-02 DIAGNOSIS — K2270 Barrett's esophagus without dysplasia: Secondary | ICD-10-CM | POA: Diagnosis present

## 2017-05-02 DIAGNOSIS — Z8501 Personal history of malignant neoplasm of esophagus: Secondary | ICD-10-CM

## 2017-05-02 DIAGNOSIS — R402362 Coma scale, best motor response, obeys commands, at arrival to emergency department: Secondary | ICD-10-CM | POA: Diagnosis present

## 2017-05-02 DIAGNOSIS — R402252 Coma scale, best verbal response, oriented, at arrival to emergency department: Secondary | ICD-10-CM | POA: Diagnosis not present

## 2017-05-02 DIAGNOSIS — K5900 Constipation, unspecified: Secondary | ICD-10-CM | POA: Diagnosis present

## 2017-05-02 DIAGNOSIS — G8929 Other chronic pain: Secondary | ICD-10-CM | POA: Diagnosis not present

## 2017-05-02 DIAGNOSIS — K219 Gastro-esophageal reflux disease without esophagitis: Secondary | ICD-10-CM | POA: Diagnosis not present

## 2017-05-02 DIAGNOSIS — Y92002 Bathroom of unspecified non-institutional (private) residence single-family (private) house as the place of occurrence of the external cause: Secondary | ICD-10-CM | POA: Diagnosis not present

## 2017-05-02 DIAGNOSIS — M810 Age-related osteoporosis without current pathological fracture: Secondary | ICD-10-CM | POA: Diagnosis not present

## 2017-05-02 DIAGNOSIS — N400 Enlarged prostate without lower urinary tract symptoms: Secondary | ICD-10-CM | POA: Diagnosis not present

## 2017-05-02 DIAGNOSIS — I6381 Other cerebral infarction due to occlusion or stenosis of small artery: Principal | ICD-10-CM | POA: Diagnosis present

## 2017-05-02 DIAGNOSIS — R402142 Coma scale, eyes open, spontaneous, at arrival to emergency department: Secondary | ICD-10-CM | POA: Diagnosis not present

## 2017-05-02 DIAGNOSIS — Z79899 Other long term (current) drug therapy: Secondary | ICD-10-CM | POA: Diagnosis not present

## 2017-05-02 DIAGNOSIS — I639 Cerebral infarction, unspecified: Secondary | ICD-10-CM

## 2017-05-02 DIAGNOSIS — Z8249 Family history of ischemic heart disease and other diseases of the circulatory system: Secondary | ICD-10-CM | POA: Diagnosis not present

## 2017-05-02 DIAGNOSIS — R109 Unspecified abdominal pain: Secondary | ICD-10-CM | POA: Diagnosis not present

## 2017-05-02 DIAGNOSIS — Z7982 Long term (current) use of aspirin: Secondary | ICD-10-CM | POA: Diagnosis not present

## 2017-05-02 DIAGNOSIS — R29700 NIHSS score 0: Secondary | ICD-10-CM | POA: Diagnosis present

## 2017-05-02 DIAGNOSIS — I10 Essential (primary) hypertension: Secondary | ICD-10-CM | POA: Diagnosis present

## 2017-05-02 DIAGNOSIS — R270 Ataxia, unspecified: Secondary | ICD-10-CM | POA: Diagnosis present

## 2017-05-02 DIAGNOSIS — R29898 Other symptoms and signs involving the musculoskeletal system: Secondary | ICD-10-CM | POA: Diagnosis present

## 2017-05-02 DIAGNOSIS — I69351 Hemiplegia and hemiparesis following cerebral infarction affecting right dominant side: Secondary | ICD-10-CM

## 2017-05-02 HISTORY — DX: Other chronic pain: G89.29

## 2017-05-02 HISTORY — DX: Unspecified abdominal pain: R10.9

## 2017-05-02 LAB — COMPREHENSIVE METABOLIC PANEL
ALT: 45 U/L (ref 17–63)
ANION GAP: 10 (ref 5–15)
AST: 29 U/L (ref 15–41)
Albumin: 4.3 g/dL (ref 3.5–5.0)
Alkaline Phosphatase: 85 U/L (ref 38–126)
BUN: 16 mg/dL (ref 6–20)
CALCIUM: 9.1 mg/dL (ref 8.9–10.3)
CHLORIDE: 104 mmol/L (ref 101–111)
CO2: 25 mmol/L (ref 22–32)
CREATININE: 1.08 mg/dL (ref 0.61–1.24)
GLUCOSE: 106 mg/dL — AB (ref 65–99)
POTASSIUM: 4.2 mmol/L (ref 3.5–5.1)
SODIUM: 139 mmol/L (ref 135–145)
TOTAL PROTEIN: 7.4 g/dL (ref 6.5–8.1)
Total Bilirubin: 0.4 mg/dL (ref 0.3–1.2)

## 2017-05-02 LAB — RAPID URINE DRUG SCREEN, HOSP PERFORMED
Amphetamines: NOT DETECTED
BARBITURATES: NOT DETECTED
BENZODIAZEPINES: NOT DETECTED
Cocaine: NOT DETECTED
Opiates: POSITIVE — AB
Tetrahydrocannabinol: NOT DETECTED

## 2017-05-02 LAB — DIFFERENTIAL
BASOS PCT: 0 %
Basophils Absolute: 0 10*3/uL (ref 0.0–0.1)
EOS ABS: 0 10*3/uL (ref 0.0–0.7)
Eosinophils Relative: 1 %
Lymphocytes Relative: 25 %
Lymphs Abs: 1.3 10*3/uL (ref 0.7–4.0)
MONO ABS: 0.4 10*3/uL (ref 0.1–1.0)
MONOS PCT: 8 %
Neutro Abs: 3.5 10*3/uL (ref 1.7–7.7)
Neutrophils Relative %: 66 %

## 2017-05-02 LAB — ETHANOL

## 2017-05-02 LAB — CBC
HEMATOCRIT: 43.8 % (ref 39.0–52.0)
Hemoglobin: 14 g/dL (ref 13.0–17.0)
MCH: 27.3 pg (ref 26.0–34.0)
MCHC: 32 g/dL (ref 30.0–36.0)
MCV: 85.5 fL (ref 78.0–100.0)
PLATELETS: 191 10*3/uL (ref 150–400)
RBC: 5.12 MIL/uL (ref 4.22–5.81)
RDW: 13.5 % (ref 11.5–15.5)
WBC: 5.3 10*3/uL (ref 4.0–10.5)

## 2017-05-02 LAB — URINALYSIS, ROUTINE W REFLEX MICROSCOPIC
BACTERIA UA: NONE SEEN
BILIRUBIN URINE: NEGATIVE
GLUCOSE, UA: NEGATIVE mg/dL
KETONES UR: NEGATIVE mg/dL
NITRITE: NEGATIVE
PH: 7 (ref 5.0–8.0)
PROTEIN: NEGATIVE mg/dL
Specific Gravity, Urine: 1.013 (ref 1.005–1.030)

## 2017-05-02 LAB — I-STAT CHEM 8, ED
BUN: 15 mg/dL (ref 6–20)
CALCIUM ION: 1.19 mmol/L (ref 1.15–1.40)
Chloride: 101 mmol/L (ref 101–111)
Creatinine, Ser: 1 mg/dL (ref 0.61–1.24)
GLUCOSE: 103 mg/dL — AB (ref 65–99)
HCT: 44 % (ref 39.0–52.0)
Hemoglobin: 15 g/dL (ref 13.0–17.0)
POTASSIUM: 4 mmol/L (ref 3.5–5.1)
Sodium: 139 mmol/L (ref 135–145)
TCO2: 27 mmol/L (ref 22–32)

## 2017-05-02 LAB — CBG MONITORING, ED: Glucose-Capillary: 92 mg/dL (ref 65–99)

## 2017-05-02 LAB — PROTIME-INR
INR: 1
PROTHROMBIN TIME: 13.1 s (ref 11.4–15.2)

## 2017-05-02 LAB — I-STAT TROPONIN, ED: Troponin i, poc: 0 ng/mL (ref 0.00–0.08)

## 2017-05-02 LAB — APTT: APTT: 30 s (ref 24–36)

## 2017-05-02 MED ORDER — STROKE: EARLY STAGES OF RECOVERY BOOK
Freq: Once | Status: AC
  Administered 2017-05-02
  Filled 2017-05-02: qty 1

## 2017-05-02 MED ORDER — VITAMIN D (ERGOCALCIFEROL) 50 MCG (2000 UT) PO CAPS: 1.0000 | ORAL_CAPSULE | Freq: Every day | ORAL | Status: DC

## 2017-05-02 MED ORDER — ASPIRIN 300 MG RE SUPP: 300.0000 mg | Freq: Every day | RECTAL | Status: DC

## 2017-05-02 MED ORDER — FAMOTIDINE 20 MG PO TABS
10.0000 mg | ORAL_TABLET | Freq: Two times a day (BID) | ORAL | Status: DC
  Administered 2017-05-02 – 2017-05-04 (×4): 10 mg via ORAL
  Filled 2017-05-02 (×4): qty 1

## 2017-05-02 MED ORDER — ACETAMINOPHEN 160 MG/5ML PO SOLN: 650.0000 mg | ORAL | Status: DC | PRN

## 2017-05-02 MED ORDER — ACETAMINOPHEN 650 MG RE SUPP: 650.0000 mg | RECTAL | Status: DC | PRN

## 2017-05-02 MED ORDER — ROSUVASTATIN CALCIUM 20 MG PO TABS
20.0000 mg | ORAL_TABLET | Freq: Every day | ORAL | Status: DC
  Administered 2017-05-03: 20 mg via ORAL
  Filled 2017-05-02: qty 1

## 2017-05-02 MED ORDER — AMITRIPTYLINE HCL 50 MG PO TABS: 50.0000 mg | ORAL_TABLET | Freq: Every evening | ORAL | Status: DC | PRN

## 2017-05-02 MED ORDER — LINACLOTIDE 145 MCG PO CAPS
290.0000 ug | ORAL_CAPSULE | Freq: Every day | ORAL | Status: DC
  Administered 2017-05-03 – 2017-05-04 (×2): 290 ug via ORAL
  Filled 2017-05-02 (×3): qty 2

## 2017-05-02 MED ORDER — LISINOPRIL 10 MG PO TABS
10.0000 mg | ORAL_TABLET | Freq: Every day | ORAL | Status: DC
  Administered 2017-05-02 – 2017-05-04 (×3): 10 mg via ORAL
  Filled 2017-05-02 (×3): qty 1

## 2017-05-02 MED ORDER — ACETAMINOPHEN 325 MG PO TABS
650.0000 mg | ORAL_TABLET | ORAL | Status: DC | PRN
Start: 2017-05-02 — End: 2017-05-04

## 2017-05-02 MED ORDER — OXYCODONE HCL 5 MG PO TABS
15.0000 mg | ORAL_TABLET | ORAL | Status: DC | PRN
  Administered 2017-05-03 – 2017-05-04 (×6): 15 mg via ORAL
  Filled 2017-05-02 (×6): qty 3

## 2017-05-02 MED ORDER — TAMSULOSIN HCL 0.4 MG PO CAPS
0.4000 mg | ORAL_CAPSULE | Freq: Every day | ORAL | Status: DC
  Administered 2017-05-02 – 2017-05-04 (×3): 0.4 mg via ORAL
  Filled 2017-05-02 (×3): qty 1

## 2017-05-02 MED ORDER — ASPIRIN 325 MG PO TABS
325.0000 mg | ORAL_TABLET | Freq: Every day | ORAL | Status: DC
  Administered 2017-05-02 – 2017-05-04 (×3): 325 mg via ORAL
  Filled 2017-05-02 (×3): qty 1

## 2017-05-02 NOTE — ED Notes (Signed)
Pt's CBG=92  

## 2017-05-02 NOTE — Progress Notes (Signed)
Attempted to get report, when ever the nurse is available my number is [phone]. RN from [facility].

## 2017-05-02 NOTE — ED Triage Notes (Addendum)
Pt has had facial droop and headache since yesterday. Pt feels weaker on right side. A/o. Facial droop present. Slurred speech also. Symptoms noted at 3am 05/01/17

## 2017-05-02 NOTE — ED Provider Notes (Signed)
[facility] EMERGENCY DEPARTMENT Provider Note   CSN: [id number] Arrival date & time: 05/02/17  1209     History   Chief Complaint Chief Complaint  Patient presents with  . Headache    stroke    HPI Vinicius Brockman is a 72 y.o. male.   Headache     Pt was seen at 1320. Per pt and his family, c/o gradual onset and persistence of constant right sided weakness since overnight 2 night ago. Pt states he woke up at 0300 on 05/01/2017 and noticed his right leg was weak, and he "wasn't walking right." Pt states he fell to the floor due to his symptoms. Pt then notice his right arm "was weak too." Pt's family states they spoke to him on the phone last night at 2100 and noticed his speech was slurred. Pt endorses "headache" in random places on his scalp for the past few days, lasts for few seconds each episode. Denies dysphagia, no tingling/numbness in extremities, no visual changes, no CP/palpitations, no SOB/cough, no abd pain, no N/V/D, no neck or back pain, no syncope.   Past Medical History:  Diagnosis Date  . Barrett esophagus   . Chronic abdominal pain   . Esophageal cancer ([place])   . GERD (gastroesophageal reflux disease)   . H. pylori infection   . Hiatal hernia   . Hypertension   . Renal calculus   . Senile osteoporosis     Patient Active Problem List   Diagnosis Date Noted  . Right leg weakness 05/02/2017  . Stroke ([place]) 05/02/2017  . 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 Barrett's esophagus 08/09/2014  . Chronic epigastric pain 08/09/2014  . Constipation due to opioid therapy 08/09/2014  . History of Helicobacter pylori infection 08/09/2014    Past Surgical History:  Procedure Laterality Date  . BREATH TEK H PYLORI N/A 06/28/2014   Procedure: BREATH TEK H PYLORI;  Surgeon: Jerene Bears, MD;  Location: [facility];  Service: Gastroenterology;  Laterality: N/A;  . CHOLECYSTECTOMY    . COLONOSCOPY    . TONSILLECTOMY         Home Medications    Prior  to Admission medications   Medication Sig Start Date End Date Taking? Authorizing Provider  amitriptyline (ELAVIL) 50 MG tablet TAKE ONE TABLET BY MOUTH ONCE DAILY AT BEDTIME (DISCONTINUE  AMITRIPTYLINE  25MG ) 08/04/15  Yes Pyrtle, Lajuan Lines, MD  Linaclotide (LINZESS) 290 MCG CAPS capsule Take 1 capsule (290 mcg total) by mouth daily. 04/18/14  Yes Pyrtle, Lajuan Lines, MD  lisinopril (PRINIVIL,ZESTRIL) 10 MG tablet Take 10 mg by mouth.   Yes [provider]  naproxen sodium (ALEVE) 220 MG tablet Take 220 mg by mouth.   Yes [provider]  oxyCODONE (ROXICODONE) 15 MG immediate release tablet Take 1 tablet by mouth every 5 (five) hours as needed. 04/22/17  Yes [provider]  ranitidine (ZANTAC) 300 MG tablet Take 300 mg by mouth at bedtime.   Yes [provider]  tamsulosin (FLOMAX) 0.4 MG CAPS capsule Take 0.4 mg by mouth daily.   Yes [provider]  Vitamin D, Ergocalciferol, 2000 units CAPS Take 1 capsule by mouth daily.   Yes [provider]  promethazine (PHENERGAN) 25 MG tablet Take 1 tablet (25 mg total) by mouth every 6 (six) hours as needed for nausea or vomiting. Patient not taking: Reported on 05/02/2017 05/13/14   Jerene Bears, MD  sucralfate (CARAFATE) 1 GM/10ML suspension Take 10 mLs (1 g total)  by mouth 4 (four) times daily. Patient not taking: Reported on 05/02/2017 01/25/15   Pyrtle, Lajuan Lines, MD    Family History Family History  Problem Relation Age of Onset  . Diabetes Father   . Heart disease Father   . Prostate cancer Father   . Diabetes Mother   . Kidney failure Mother   . Gout Mother   . Kidney disease Mother   . Diabetes Sister        twin    Social History Social History   Tobacco Use  . Smoking status: Never Smoker  . Smokeless tobacco: Never Used  Substance Use Topics  . Alcohol use: No    Alcohol/week: 0.0 oz  . Drug use: No     Allergies   Patient has no known allergies.   Review of Systems Review of  Systems  Neurological: Positive for headaches.  ROS: Statement: All systems negative except as marked or noted in the HPI; Constitutional: Negative for fever and chills. ; ; Eyes: Negative for eye pain, redness and discharge. ; ; ENMT: Negative for ear pain, hoarseness, nasal congestion, sinus pressure and sore throat. ; ; Cardiovascular: Negative for chest pain, palpitations, diaphoresis, dyspnea and peripheral edema. ; ; Respiratory: Negative for cough, wheezing and stridor. ; ; Gastrointestinal: Negative for nausea, vomiting, diarrhea, abdominal pain, blood in stool, hematemesis, jaundice and rectal bleeding. . ; ; Genitourinary: Negative for dysuria, flank pain and hematuria. ; ; Musculoskeletal: Negative for back pain and neck pain. Negative for swelling and trauma.; ; Skin: Negative for pruritus, rash, abrasions, blisters, bruising and skin lesion.; ; Neuro: +headaches, facial droop, slurred speech, extremity weakness. Negative for lightheadedness and neck stiffness. Negative for altered level of consciousness, altered mental status, paresthesias, involuntary movement, seizure and syncope.      Physical Exam Updated Vital Signs BP (!) 164/91   Pulse 80   Temp 97.9 F (36.6 C) (Oral)   Resp 10   Ht 5\' 5"  (1.651 m)   Wt 81.6 kg (180 lb)   SpO2 99%   BMI 29.95 kg/m   Physical Exam 1325:Physical examination:  Nursing notes reviewed; Vital signs and O2 SAT reviewed;  Constitutional: Well developed, Well nourished, Well hydrated, In no acute distress; Head:  Normocephalic, atraumatic; Eyes: EOMI, PERRL, No scleral icterus; ENMT: Mouth and pharynx normal, Mucous membranes moist; Neck: Supple, Full range of motion, No lymphadenopathy; Cardiovascular: Regular rate and rhythm, No gallop; Respiratory: Breath sounds clear & equal bilaterally, No wheezes.  Speaking full sentences with ease, Normal respiratory effort/excursion; Chest: Nontender, Movement normal; Abdomen: Soft, Nontender, Nondistended,  Normal bowel sounds; Genitourinary: No CVA tenderness; Extremities: Pulses normal, No tenderness, No edema, +right calf tenderness, no calf edema or asymmetry.; Neuro: AA&Ox3, Major CN grossly intact. Speech clear. +right facial droop.  Grips R<L. Strength 5/5 LUE and LLE; strength 4/5 RUE and RLE.   No gross sensory deficits.  Normal cerebellar testing LUE (finger-nose) and LLE (heel-shin), +ataxic RUE and RLE..; Skin: Color normal, Warm, Dry.   ED Treatments / Results  Labs (all labs ordered are listed, but only abnormal results are displayed)   EKG  EKG Interpretation  Date/Time:  Friday May 02 2017 12:39:44 EST Ventricular Rate:  86 PR Interval:    QRS Duration: 100 QT Interval:  381 QTC Calculation: 456 R Axis:   64 Text Interpretation:  Sinus rhythm Baseline wander No old tracing to compare Confirmed by Francine Graven [phone]) on 05/02/2017 1:30:24 PM  Radiology   Procedures Procedures (including critical care time)  Medications Ordered in ED Medications - No data to display   Initial Impression / Assessment and Plan / ED Course  I have reviewed the triage vital signs and the nursing notes.  Pertinent labs & imaging results that were available during my care of the patient were reviewed by me and considered in my medical decision making (see chart for details).  MDM Reviewed: previous chart, nursing note and vitals Reviewed previous: labs and ECG Interpretation: labs, ECG, x-ray, MRI and CT scan   Results for orders placed or performed during the hospital encounter of 05/02/17  Protime-INR  Result Value Ref Range   Prothrombin Time 13.1 11.4 - 15.2 seconds   INR 1.00   APTT  Result Value Ref Range   aPTT 30 24 - 36 seconds  CBC  Result Value Ref Range   WBC 5.3 4.0 - 10.5 K/uL   RBC 5.12 4.22 - 5.81 MIL/uL   Hemoglobin 14.0 13.0 - 17.0 g/dL   HCT 43.8 39.0 - 52.0 %   MCV 85.5 78.0 - 100.0 fL   MCH 27.3 26.0 - 34.0 pg   MCHC 32.0 30.0 - 36.0  g/dL   RDW 13.5 11.5 - 15.5 %   Platelets 191 150 - 400 K/uL  Differential  Result Value Ref Range   Neutrophils Relative % 66 %   Neutro Abs 3.5 1.7 - 7.7 K/uL   Lymphocytes Relative 25 %   Lymphs Abs 1.3 0.7 - 4.0 K/uL   Monocytes Relative 8 %   Monocytes Absolute 0.4 0.1 - 1.0 K/uL   Eosinophils Relative 1 %   Eosinophils Absolute 0.0 0.0 - 0.7 K/uL   Basophils Relative 0 %   Basophils Absolute 0.0 0.0 - 0.1 K/uL  Comprehensive metabolic panel  Result Value Ref Range   Sodium 139 135 - 145 mmol/L   Potassium 4.2 3.5 - 5.1 mmol/L   Chloride 104 101 - 111 mmol/L   CO2 25 22 - 32 mmol/L   Glucose, Bld 106 (H) 65 - 99 mg/dL   BUN 16 6 - 20 mg/dL   Creatinine, Ser 1.08 0.61 - 1.24 mg/dL   Calcium 9.1 8.9 - 10.3 mg/dL   Total Protein 7.4 6.5 - 8.1 g/dL   Albumin 4.3 3.5 - 5.0 g/dL   AST 29 15 - 41 U/L   ALT 45 17 - 63 U/L   Alkaline Phosphatase 85 38 - 126 U/L   Total Bilirubin 0.4 0.3 - 1.2 mg/dL   GFR calc non Af Amer >60 >60 mL/min   GFR calc Af Amer >60 >60 mL/min   Anion gap 10 5 - 15  Ethanol  Result Value Ref Range   Alcohol, Ethyl (B) <10 <10 mg/dL  I-stat troponin, ED  Result Value Ref Range   Troponin i, poc 0.00 0.00 - 0.08 ng/mL   Comment 3          CBG monitoring, ED  Result Value Ref Range   Glucose-Capillary 92 65 - 99 mg/dL  I-Stat Chem 8, ED  Result Value Ref Range   Sodium 139 135 - 145 mmol/L   Potassium 4.0 3.5 - 5.1 mmol/L   Chloride 101 101 - 111 mmol/L   BUN 15 6 - 20 mg/dL   Creatinine, Ser 1.00 0.61 - 1.24 mg/dL   Glucose, Bld 103 (H) 65 - 99 mg/dL   Calcium, Ion 1.19 1.15 - 1.40 mmol/L   TCO2 27 22 -  32 mmol/L   Hemoglobin 15.0 13.0 - 17.0 g/dL   HCT 44.0 39.0 - 52.0 %   Dg Chest 2 View Result Date: 05/02/2017 CLINICAL DATA:  72 year old male with right side weakness for several days. EXAM: CHEST  2 VIEW COMPARISON:  Chest CT 11/02/2014 and earlier. FINDINGS: Lung volumes are within normal limits. Mediastinal contours remain normal.  Visualized tracheal air column is within normal limits. No pneumothorax, pulmonary edema, pleural effusion or confluent pulmonary opacity. Mild chronic scoliosis. No acute osseous abnormality identified. Stable cholecystectomy clips. Negative visible bowel gas pattern. IMPRESSION: Negative.  No acute cardiopulmonary abnormality. Electronically Signed   By: Genevie Ann M.D.   On: 05/02/2017 14:40   Ct Head Wo Contrast Result Date: 05/02/2017 CLINICAL DATA:  Slurred speech, right-sided weakness. EXAM: CT HEAD WITHOUT CONTRAST TECHNIQUE: Contiguous axial images were obtained from the base of the skull through the vertex without intravenous contrast. COMPARISON:  None. FINDINGS: Brain: No evidence of acute infarction, hemorrhage, hydrocephalus, extra-axial collection or mass lesion/mass effect. Vascular: No hyperdense vessel or unexpected calcification. Skull: Normal. Negative for fracture or focal lesion. Sinuses/Orbits: No acute finding. Other: None. IMPRESSION: Normal head CT. Electronically Signed   By: Marijo Conception, M.D.   On: 05/02/2017 12:44   Mr Brain Wo Contrast (neuro Protocol) Result Date: 05/02/2017 CLINICAL DATA:  72 year old male with right side weakness, headache and facial droop since yesterday. Slurred speech. Personal history of esophageal cancer. EXAM: MRI HEAD WITHOUT CONTRAST TECHNIQUE: Multiplanar, multiecho pulse sequences of the brain and surrounding structures were obtained without intravenous contrast. COMPARISON:  Head CT without contrast 1223 hr today. FINDINGS: Brain: There is a 12 millimeter wedge-shaped area of restricted diffusion in the left paracentral pons (series 3, image 67) with faint associated T2 and FLAIR hyperintensity. No associated hemorrhage or mass effect. No other restricted diffusion. Outside of the pons gray and white matter signal is normal for age. No cortical encephalomalacia or chronic cerebral blood products. No midline shift, mass effect, evidence of mass  lesion, ventriculomegaly, extra-axial collection or acute intracranial hemorrhage. Cervicomedullary junction and pituitary are within normal limits. Vascular: Major intracranial vascular flow voids are preserved, the distal left vertebral artery is dominant. Skull and upper cervical spine: Negative visible cervical spine. Normal bone marrow signal. Sinuses/Orbits: Normal orbits soft tissues. Mild bilateral ethmoid sinus mucosal thickening with trace paranasal sinus mucosal thickening otherwise. Other: Mastoid air cells are clear. Visible internal auditory structures appear normal. Scalp and face soft tissues appear negative. IMPRESSION: 1. Acute lacunar infarct in the left paracentral pons. No associated hemorrhage or mass effect. 2. Otherwise normal for age noncontrast MRI appearance of the brain. Electronically Signed   By: Genevie Ann M.D.   On: 05/02/2017 14:48   US Venous Img Lower Right (dvt Study) Result Date: 05/02/2017 CLINICAL DATA:  Acute right calf pain. EXAM: Right LOWER EXTREMITY VENOUS DOPPLER ULTRASOUND TECHNIQUE: Gray-scale sonography with graded compression, as well as color Doppler and duplex ultrasound were performed to evaluate the lower extremity deep venous systems from the level of the common femoral vein and including the common femoral, femoral, profunda femoral, popliteal and calf veins including the posterior tibial, peroneal and gastrocnemius veins when visible. The superficial great saphenous vein was also interrogated. Spectral Doppler was utilized to evaluate flow at rest and with distal augmentation maneuvers in the common femoral, femoral and popliteal veins. COMPARISON:  None. FINDINGS: Contralateral Common Femoral Vein: Respiratory phasicity is normal and symmetric with the symptomatic side. No evidence of thrombus.  Normal compressibility. Common Femoral Vein: No evidence of thrombus. Normal compressibility, respiratory phasicity and response to augmentation. Saphenofemoral  Junction: No evidence of thrombus. Normal compressibility and flow on color Doppler imaging. Profunda Femoral Vein: No evidence of thrombus. Normal compressibility and flow on color Doppler imaging. Femoral Vein: No evidence of thrombus. Normal compressibility, respiratory phasicity and response to augmentation. Popliteal Vein: No evidence of thrombus. Normal compressibility, respiratory phasicity and response to augmentation. Calf Veins: No evidence of thrombus. Normal compressibility and flow on color Doppler imaging. Superficial Great Saphenous Vein: No evidence of thrombus. Normal compressibility. Venous Reflux:  None. Other Findings:  None. IMPRESSION: No evidence of deep venous thrombosis in right lower extremity. Electronically Signed   By: Marijo Conception, M.D.   On: 05/02/2017 15:09   Mr Virgel Paling (cerebral Arteries) Result Date: 05/02/2017 CLINICAL DATA:  72 year old male with acute pontine lacunar infarct on brain MRI today. Right side weakness, headache and facial droop since yesterday. Slurred speech. Personal history of esophageal cancer. EXAM: MRA HEAD WITHOUT CONTRAST TECHNIQUE: Angiographic images of the Circle of Willis were obtained using MRA technique without intravenous contrast. COMPARISON:  Brain MRI today reported separately. FINDINGS: Antegrade flow in the posterior circulation with dominant distal left vertebral artery. The non dominant distal right vertebral artery terminates in the right PICA. Patent left PICA origin. No distal left vertebral artery stenosis. The left vertebral supplies the basilar. There is no basilar artery irregularity or stenosis. The SCA and left PCA origins are normal. There is a fetal type right PCA origin. Bilateral PCA branches are within normal limits. Antegrade flow in both ICA siphons. Mild bilateral siphon irregularity. No significant ICA siphon stenosis. Normal ophthalmic and right posterior communicating artery origins. The left posterior communicating  artery is diminutive or absent. Patent carotid termini. Normal MCA and ACA origins. Diminutive anterior communicating artery. Bilateral ACA branches are within normal limits. Left MCA M1 segment, MCA bifurcation and visible left MCA branches are within normal limits. Right MCA M1 segment, bifurcation, and visible right MCA branches are within normal limits. IMPRESSION: 1. Negative posterior circulation. No Basilar Artery irregularity or stenosis. 2. 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 Negative anterior circulation otherwise. Electronically Signed   By: Genevie Ann M.D.   On: 05/02/2017 14:52    1520:  Pt not Code Stroke d/t delayed presentation (symptoms started 1.5 days ago). MRI with acute stroke. Dx and testing d/w pt and family.  Questions answered.  Verb understanding, agreeable to admit.   T/C to [facility] Dr. Maudie Mercury, case discussed, including:  HPI, pertinent PM/SHx, VS/PE, dx testing, ED course and treatment:  Agreeable to admit.    Final Clinical Impressions(s) / ED Diagnoses   Final diagnoses:  None    ED Discharge Orders    None        Francine Graven, DO 05/04/17 2121

## 2017-05-02 NOTE — H&P (Addendum)
TRH H&P   Patient Demographics:    Douglas Odonnell, is a 72 y.o. male  MRN: [id number]   DOB - 1945-09-16  Admit Date - 05/02/2017  Outpatient Primary MD for the patient is Octavio Graves, DO  Referring MD/NP/PA:   Francine Graven  Outpatient Specialists:     Patient coming from: home  Chief Complaint  Patient presents with  . Headache    stroke      HPI:    Douglas Odonnell  is a 72 y.o. male, w hypertension, gerd, h/o esophageal cancer / Barretts esophagus apparently c/o right leg pain and weakness and also right arm weakness as well started about 3am Thursday am. Pt fell in the bedroom while getting up to go to the bathroom, speech was slurred last nite at about 9pm.  Pt has slight headache, no vision change.  Denies cp, palp, sob, numbness, tingling, incontinence.  Pt was brought to ER due to continued right sided weakness.    In ED,  CT brain IMPRESSION: Normal head CT.  MRI Brain IMPRESSION: 1. Acute lacunar infarct in the left paracentral pons. No associated hemorrhage or mass effect. 2. Otherwise normal for age noncontrast MRI appearance of the brain.  Ultrasound IMPRESSION: No evidence of deep venous thrombosis in right lower extremity.  Na 139, K 4.2, Glucose 106, Bun 16, Creatinine 1.08  Ast 29, Alt 45  Etoh <10  Trop 0.00   EKG nsr at 85, nl axis, early R progression, no st-t changes c/w ischemia.   Pt will be admitted for acute CVA.  Not a candidate for TPA, outside TPA window     Review of systems:    In addition to the HPI above,  No Fever-chills, No Headache, No changes with Vision or hearing, No problems swallowing food or Liquids, No Chest pain, Cough or Shortness of Breath, No Abdominal pain, No Nausea or Vommitting, Bowel movements are regular, No Blood in stool or Urine, No dysuria, No new skin rashes or bruises, No new  joints pains-aches,   No recent weight gain or loss, No polyuria, polydypsia or polyphagia, No significant Mental Stressors.  A full 10 point Review of Systems was done, except as stated above, all other Review of Systems were negative.   With Past History of the following :    Past Medical History:  Diagnosis Date  . Barrett esophagus   . Chronic abdominal pain   . Esophageal cancer ([place])   . GERD (gastroesophageal reflux disease)   . H. pylori infection   . Hiatal hernia   . Hypertension   . Renal calculus   . Senile osteoporosis       Past Surgical History:  Procedure Laterality Date  . BREATH TEK H PYLORI N/A 06/28/2014   Procedure: BREATH TEK H PYLORI;  Surgeon: Jerene Bears, MD;  Location: [facility];  Service: Gastroenterology;  Laterality:  N/A;  . CHOLECYSTECTOMY    . COLONOSCOPY    . TONSILLECTOMY        Social History:     Social History   Tobacco Use  . Smoking status: Never Smoker  . Smokeless tobacco: Never Used  Substance Use Topics  . Alcohol use: No    Alcohol/week: 0.0 oz     Lives - at home  Mobility - walks by self   Family History :     Family History  Problem Relation Age of Onset  . Diabetes Father   . Heart disease Father   . Prostate cancer Father   . Diabetes Mother   . Kidney failure Mother   . Gout Mother   . Kidney disease Mother   . Diabetes Sister        twin      Home Medications:   Prior to Admission medications   Medication Sig Start Date End Date Taking? Authorizing Provider  amitriptyline (ELAVIL) 50 MG tablet TAKE ONE TABLET BY MOUTH ONCE DAILY AT BEDTIME (DISCONTINUE  AMITRIPTYLINE  25MG ) 08/04/15  Yes Pyrtle, Lajuan Lines, MD  Linaclotide (LINZESS) 290 MCG CAPS capsule Take 1 capsule (290 mcg total) by mouth daily. 04/18/14  Yes Pyrtle, Lajuan Lines, MD  lisinopril (PRINIVIL,ZESTRIL) 10 MG tablet Take 10 mg by mouth.   Yes [provider]  naproxen sodium (ALEVE) 220 MG tablet Take 220 mg by mouth.   Yes  [provider]  oxyCODONE (ROXICODONE) 15 MG immediate release tablet Take 1 tablet by mouth every 5 (five) hours as needed. 04/22/17  Yes [provider]  ranitidine (ZANTAC) 300 MG tablet Take 300 mg by mouth at bedtime.   Yes [provider]  tamsulosin (FLOMAX) 0.4 MG CAPS capsule Take 0.4 mg by mouth daily.   Yes [provider]  Vitamin D, Ergocalciferol, 2000 units CAPS Take 1 capsule by mouth daily.   Yes [provider]  promethazine (PHENERGAN) 25 MG tablet Take 1 tablet (25 mg total) by mouth every 6 (six) hours as needed for nausea or vomiting. Patient not taking: Reported on 05/02/2017 05/13/14   Pyrtle, Lajuan Lines, MD  sucralfate (CARAFATE) 1 GM/10ML suspension Take 10 mLs (1 g total) by mouth 4 (four) times daily. Patient not taking: Reported on 05/02/2017 01/25/15   Jerene Bears, MD     Allergies:    No Known Allergies   Physical Exam:   Vitals  Blood pressure (!) 164/91, pulse 80, temperature 97.9 F (36.6 C), temperature source Oral, resp. rate 10, height 5\' 5"  (1.651 m), weight 81.6 kg (180 lb), SpO2 99 %.   1. General  lying in bed in NAD,    2. Normal affect and insight, Not Suicidal or Homicidal, Awake Alert, Oriented X 3.  3. No F.N deficits, ALL C.Nerves Intact, Strength 5/5 all 4 extremities, Sensation intact all 4 extremities, Plantars flat on right and  down going on the left  4. Ears and Eyes appear Normal, Conjunctivae clear, PERRLA. Moist Oral Mucosa.  5. Supple Neck, No JVD, No cervical lymphadenopathy appriciated, No Carotid Bruits.  6. Symmetrical Chest wall movement, Good air movement bilaterally, CTAB.  7. RRR, No Gallops, Rubs or Murmurs, No Parasternal Heave.  8. Positive Bowel Sounds, Abdomen Soft, No tenderness, No organomegaly appriciated,No rebound -guarding or rigidity.  9.  No Cyanosis, Normal Skin Turgor, No Skin Rash or Bruise.  10. Good muscle tone,  joints appear normal , no effusions, Normal  ROM.  11. No Palpable Lymph Nodes in Neck or Axillae    Data Review:    CBC Recent Labs  Lab 05/02/17 1239 05/02/17 1255  WBC 5.3  --   HGB 14.0 15.0  HCT 43.8 44.0  PLT 191  --   MCV 85.5  --   MCH 27.3  --   MCHC 32.0  --   RDW 13.5  --   LYMPHSABS 1.3  --   MONOABS 0.4  --   EOSABS 0.0  --   BASOSABS 0.0  --    ------------------------------------------------------------------------------------------------------------------  Chemistries  Recent Labs  Lab 05/02/17 1239 05/02/17 1255  NA 139 139  K 4.2 4.0  CL 104 101  CO2 25  --   GLUCOSE 106* 103*  BUN 16 15  CREATININE 1.08 1.00  CALCIUM 9.1  --   AST 29  --   ALT 45  --   ALKPHOS 85  --   BILITOT 0.4  --    ------------------------------------------------------------------------------------------------------------------ estimated creatinine clearance is 66.6 mL/min (by C-G formula based on SCr of 1 mg/dL). ------------------------------------------------------------------------------------------------------------------ No results for input(s): TSH, T4TOTAL, T3FREE, THYROIDAB in the last 72 hours.  Invalid input(s): FREET3  Coagulation profile Recent Labs  Lab 05/02/17 1239  INR 1.00   ------------------------------------------------------------------------------------------------------------------- No results for input(s): DDIMER in the last 72 hours. -------------------------------------------------------------------------------------------------------------------  Cardiac Enzymes No results for input(s): CKMB, TROPONINI, MYOGLOBIN in the last 168 hours.  Invalid input(s): CK ------------------------------------------------------------------------------------------------------------------ No results found for: BNP   ---------------------------------------------------------------------------------------------------------------  Urinalysis    Component Value Date/Time   COLORURINE YELLOW  02/07/2015 [place] 02/07/2015 1123   LABSPEC 1.015 02/07/2015 1123   PHURINE 6.5 02/07/2015 1123   GLUCOSEU NEGATIVE 02/07/2015 1123   HGBUR NEGATIVE 02/07/2015 [place] 02/07/2015 1123   KETONESUR NEGATIVE 02/07/2015 1123   UROBILINOGEN 1.0 02/07/2015 1123   NITRITE NEGATIVE 02/07/2015 1123   LEUKOCYTESUR NEGATIVE 02/07/2015 1123    ----------------------------------------------------------------------------------------------------------------   Imaging Results:    Dg Chest 2 View  Result Date: 05/02/2017 CLINICAL DATA:  72 year old male with right side weakness for several days. EXAM: CHEST  2 VIEW COMPARISON:  Chest CT 11/02/2014 and earlier. FINDINGS: Lung volumes are within normal limits. Mediastinal contours remain normal. Visualized tracheal air column is within normal limits. No pneumothorax, pulmonary edema, pleural effusion or confluent pulmonary opacity. Mild chronic scoliosis. No acute osseous abnormality identified. Stable cholecystectomy clips. Negative visible bowel gas pattern. IMPRESSION: Negative.  No acute cardiopulmonary abnormality. Electronically Signed   By: Genevie Ann M.D.   On: 05/02/2017 14:40   Ct Head Wo Contrast  Result Date: 05/02/2017 CLINICAL DATA:  Slurred speech, right-sided weakness. EXAM: CT HEAD WITHOUT CONTRAST TECHNIQUE: Contiguous axial images were obtained from the base of the skull through the vertex without intravenous contrast. COMPARISON:  None. FINDINGS: Brain: No evidence of acute infarction, hemorrhage, hydrocephalus, extra-axial collection or mass lesion/mass effect. Vascular: No hyperdense vessel or unexpected calcification. Skull: Normal. Negative for fracture or focal lesion. Sinuses/Orbits: No acute finding. Other: None. IMPRESSION: Normal head CT. Electronically Signed   By: Marijo Conception, M.D.   On: 05/02/2017 12:44   Mr Brain Wo Contrast (neuro Protocol)  Result Date: 05/02/2017 CLINICAL DATA:   72 year old male with right side weakness, headache and facial droop since yesterday. Slurred speech. Personal history of esophageal cancer. EXAM: MRI HEAD WITHOUT CONTRAST TECHNIQUE: Multiplanar, multiecho pulse sequences of the brain and surrounding structures were obtained without intravenous contrast. COMPARISON:  Head CT  without contrast 1223 hr today. FINDINGS: Brain: There is a 12 millimeter wedge-shaped area of restricted diffusion in the left paracentral pons (series 3, image 67) with faint associated T2 and FLAIR hyperintensity. No associated hemorrhage or mass effect. No other restricted diffusion. Outside of the pons gray and white matter signal is normal for age. No cortical encephalomalacia or chronic cerebral blood products. No midline shift, mass effect, evidence of mass lesion, ventriculomegaly, extra-axial collection or acute intracranial hemorrhage. Cervicomedullary junction and pituitary are within normal limits. Vascular: Major intracranial vascular flow voids are preserved, the distal left vertebral artery is dominant. Skull and upper cervical spine: Negative visible cervical spine. Normal bone marrow signal. Sinuses/Orbits: Normal orbits soft tissues. Mild bilateral ethmoid sinus mucosal thickening with trace paranasal sinus mucosal thickening otherwise. Other: Mastoid air cells are clear. Visible internal auditory structures appear normal. Scalp and face soft tissues appear negative. IMPRESSION: 1. Acute lacunar infarct in the left paracentral pons. No associated hemorrhage or mass effect. 2. Otherwise normal for age noncontrast MRI appearance of the brain. Electronically Signed   By: Genevie Ann M.D.   On: 05/02/2017 14:48   US Venous Img Lower Right (dvt Study)  Result Date: 05/02/2017 CLINICAL DATA:  Acute right calf pain. EXAM: Right LOWER EXTREMITY VENOUS DOPPLER ULTRASOUND TECHNIQUE: Gray-scale sonography with graded compression, as well as color Doppler and duplex ultrasound were  performed to evaluate the lower extremity deep venous systems from the level of the common femoral vein and including the common femoral, femoral, profunda femoral, popliteal and calf veins including the posterior tibial, peroneal and gastrocnemius veins when visible. The superficial great saphenous vein was also interrogated. Spectral Doppler was utilized to evaluate flow at rest and with distal augmentation maneuvers in the common femoral, femoral and popliteal veins. COMPARISON:  None. FINDINGS: Contralateral Common Femoral Vein: Respiratory phasicity is normal and symmetric with the symptomatic side. No evidence of thrombus. Normal compressibility. Common Femoral Vein: No evidence of thrombus. Normal compressibility, respiratory phasicity and response to augmentation. Saphenofemoral Junction: No evidence of thrombus. Normal compressibility and flow on color Doppler imaging. Profunda Femoral Vein: No evidence of thrombus. Normal compressibility and flow on color Doppler imaging. Femoral Vein: No evidence of thrombus. Normal compressibility, respiratory phasicity and response to augmentation. Popliteal Vein: No evidence of thrombus. Normal compressibility, respiratory phasicity and response to augmentation. Calf Veins: No evidence of thrombus. Normal compressibility and flow on color Doppler imaging. Superficial Great Saphenous Vein: No evidence of thrombus. Normal compressibility. Venous Reflux:  None. Other Findings:  None. IMPRESSION: No evidence of deep venous thrombosis in right lower extremity. Electronically Signed   By: Marijo Conception, M.D.   On: 05/02/2017 15:09   Mr Virgel Paling (cerebral Arteries)  Result Date: 05/02/2017 CLINICAL DATA:  72 year old male with acute pontine lacunar infarct on brain MRI today. Right side weakness, headache and facial droop since yesterday. Slurred speech. Personal history of esophageal cancer. EXAM: MRA HEAD WITHOUT CONTRAST TECHNIQUE: Angiographic images of the Circle  of Willis were obtained using MRA technique without intravenous contrast. COMPARISON:  Brain MRI today reported separately. FINDINGS: Antegrade flow in the posterior circulation with dominant distal left vertebral artery. The non dominant distal right vertebral artery terminates in the right PICA. Patent left PICA origin. No distal left vertebral artery stenosis. The left vertebral supplies the basilar. There is no basilar artery irregularity or stenosis. The SCA and left PCA origins are normal. There is a fetal type right PCA origin. Bilateral PCA branches are within  normal limits. Antegrade flow in both ICA siphons. Mild bilateral siphon irregularity. No significant ICA siphon stenosis. Normal ophthalmic and right posterior communicating artery origins. The left posterior communicating artery is diminutive or absent. Patent carotid termini. Normal MCA and ACA origins. Diminutive anterior communicating artery. Bilateral ACA branches are within normal limits. Left MCA M1 segment, MCA bifurcation and visible left MCA branches are within normal limits. Right MCA M1 segment, bifurcation, and visible right MCA branches are within normal limits. IMPRESSION: 1. Negative posterior circulation. No Basilar Artery irregularity or stenosis. 2. 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 Negative anterior circulation otherwise. Electronically Signed   By: Genevie Ann M.D.   On: 05/02/2017 14:52      Assessment & Plan:    Principal Problem:   Right leg weakness Active Problems:   Stroke [facility])    Acute CVA , left lacunar Carotid ultrasound Cardiac echo Check hga1c, lipid Start aspirin Start Crestor RN bedside swallow Permissive hypertension Neurology consulted, discussed case with Dr. Malen Gauze, who will have neurology see when patient arrives at [facility] since no neurology service at [facility] over the weekend  Hypertension cnot lisinopril 10mg  po qday  Bph Cont Flomax 0.4mg  po  qhs  Gerd Cont H2blocker   Chronic pain Cont oxycodone prn  Constipation Cont linzess    DVT Prophylaxis - SCDs  AM Labs Ordered, also please review Full Orders  Family Communication: Admission, patients condition and plan of care including tests being ordered have been discussed with the patient  who indicate understanding and agree with the plan and Code Status.  Code Status FULL CODE  Likely DC to  home  Condition GUARDED   Consults called: neurology  Admission status: inpatient  Time spent in minutes : 45   Jani Gravel M.D on 05/02/2017 at 3:28 PM  Between 7am to 7pm - Pager - [phone]  . After 7pm go to [email or web address] - password [facility]  [facility] - Office  [phone]

## 2017-05-02 NOTE — ED Notes (Signed)
Douglas Odonnell [phone] cell phone if needed

## 2017-05-03 ENCOUNTER — Inpatient Hospital Stay (HOSPITAL_COMMUNITY): Payer: Medicare Other

## 2017-05-03 ENCOUNTER — Other Ambulatory Visit: Payer: Self-pay

## 2017-05-03 ENCOUNTER — Encounter (HOSPITAL_COMMUNITY): Payer: Self-pay | Admitting: Radiology

## 2017-05-03 DIAGNOSIS — I6381 Other cerebral infarction due to occlusion or stenosis of small artery: Secondary | ICD-10-CM | POA: Diagnosis not present

## 2017-05-03 DIAGNOSIS — I639 Cerebral infarction, unspecified: Secondary | ICD-10-CM | POA: Diagnosis not present

## 2017-05-03 DIAGNOSIS — R29898 Other symptoms and signs involving the musculoskeletal system: Secondary | ICD-10-CM

## 2017-05-03 LAB — COMPREHENSIVE METABOLIC PANEL
ALK PHOS: 77 U/L (ref 38–126)
ALT: 27 U/L (ref 17–63)
AST: 28 U/L (ref 15–41)
Albumin: 3.8 g/dL (ref 3.5–5.0)
Anion gap: 10 (ref 5–15)
BUN: 14 mg/dL (ref 6–20)
CALCIUM: 9.2 mg/dL (ref 8.9–10.3)
CHLORIDE: 102 mmol/L (ref 101–111)
CO2: 26 mmol/L (ref 22–32)
CREATININE: 1.02 mg/dL (ref 0.61–1.24)
Glucose, Bld: 110 mg/dL — ABNORMAL HIGH (ref 65–99)
Potassium: 4.1 mmol/L (ref 3.5–5.1)
Sodium: 138 mmol/L (ref 135–145)
Total Bilirubin: 0.8 mg/dL (ref 0.3–1.2)
Total Protein: 6.6 g/dL (ref 6.5–8.1)

## 2017-05-03 LAB — SEDIMENTATION RATE: SED RATE: 3 mm/h (ref 0–16)

## 2017-05-03 LAB — LIPID PANEL
Cholesterol: 167 mg/dL (ref 0–200)
HDL: 29 mg/dL — AB (ref >40)
LDL Cholesterol: 109 mg/dL — ABNORMAL HIGH (ref 0–99)
TRIGLYCERIDES: 147 mg/dL (ref <150)
Total CHOL/HDL Ratio: 5.8 RATIO
VLDL: 29 mg/dL (ref 0–40)

## 2017-05-03 LAB — CBC
HCT: 43.2 % (ref 39.0–52.0)
Hemoglobin: 14.3 g/dL (ref 13.0–17.0)
MCH: 28.2 pg (ref 26.0–34.0)
MCHC: 33.1 g/dL (ref 30.0–36.0)
MCV: 85.2 fL (ref 78.0–100.0)
PLATELETS: 178 10*3/uL (ref 150–400)
RBC: 5.07 MIL/uL (ref 4.22–5.81)
RDW: 13.6 % (ref 11.5–15.5)
WBC: 5.8 10*3/uL (ref 4.0–10.5)

## 2017-05-03 LAB — TSH: TSH: 1.758 u[IU]/mL (ref 0.350–4.500)

## 2017-05-03 LAB — VITAMIN B12: VITAMIN B 12: 278 pg/mL (ref 180–914)

## 2017-05-03 MED ORDER — IOPAMIDOL (ISOVUE-370) INJECTION 76%
INTRAVENOUS | Status: AC
  Administered 2017-05-03: 50 mL
  Filled 2017-05-03: qty 50

## 2017-05-03 NOTE — Progress Notes (Addendum)
NEUROHOSPITALISTS STROKE TEAM - DAILY PROGRESS NOTE   ADMISSION HISTORY: Douglas Odonnell is a 72 y.o. male with a history of hypertension who presents with right-sided weakness.  He states that it started likely on Tuesday, but is not sure.  He noticed it being persistent, though it is slightly improved he states.  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  LKW: Tuesday tpa given?: no, out of window  SUBJECTIVE (INTERVAL HISTORY) No family is at the bedside. Patient is found laying in bed in NAD. Overall he feels his condition is unchanged. Voices no new complaints. No new events reported overnight.    OBJECTIVE Lab Results: CBC:  Recent Labs  Lab 05/02/17 1239 05/02/17 1255 05/03/17 0421  WBC 5.3  --  5.8  HGB 14.0 15.0 14.3  HCT 43.8 44.0 43.2  MCV 85.5  --  85.2  PLT 191  --  178   BMP: Recent Labs  Lab 05/02/17 1239 05/02/17 1255 05/03/17 0421  NA 139 139 138  K 4.2 4.0 4.1  CL 104 101 102  CO2 25  --  26  GLUCOSE 106* 103* 110*  BUN 16 15 14   CREATININE 1.08 1.00 1.02  CALCIUM 9.1  --  9.2   Liver Function Tests:  Recent Labs  Lab 05/02/17 1239 05/03/17 0421  AST 29 28  ALT 45 27  ALKPHOS 85 77  BILITOT 0.4 0.8  PROT 7.4 6.6  ALBUMIN 4.3 3.8   Thyroid Function Studies:  Recent Labs    05/03/17 0421  TSH 1.758   Coagulation Studies:  Recent Labs    05/02/17 1239  APTT 30  INR 1.00   [place]menia Work -Up:  Recent Labs    05/03/17 0421  VITAMINB12 278   Urinalysis:  Recent Labs  Lab 05/02/17 1926  COLORURINE YELLOW  APPEARANCEUR HAZY*  LABSPEC 1.013  PHURINE 7.0  GLUCOSEU NEGATIVE  HGBUR SMALL*  BILIRUBINUR NEGATIVE  KETONESUR NEGATIVE  PROTEINUR NEGATIVE  NITRITE NEGATIVE  LEUKOCYTESUR LARGE*   Urine Drug Screen:     Component Value Date/Time   LABOPIA POSITIVE (A)  05/02/2017 1926   COCAINSCRNUR NONE DETECTED 05/02/2017 1926   LABBENZ NONE DETECTED 05/02/2017 1926   AMPHETMU NONE DETECTED 05/02/2017 1926   THCU NONE DETECTED 05/02/2017 1926   LABBARB NONE DETECTED 05/02/2017 1926    Alcohol Level:  Recent Labs  Lab 05/02/17 1239  ETH <10   PHYSICAL EXAM Temp:  [97.7 F (36.5 C)-98.5 F (36.9 C)] 98.5 F (36.9 C) (02/23 0538) Pulse Rate:  [73-86] 78 (02/23 0538) Resp:  [14-17] 16 (02/22 2138) BP: (159-169)/(89-99) 159/89 (02/23 0538) SpO2:  [97 %-99 %] 97 % (02/23 0538) General - Well nourished, well developed, in no apparent distress HEENT-  Normocephalic,  Cardiovascular - Regular rate and rhythm  Respiratory - Lungs clear bilaterally. No wheezing. Abdomen - soft and non-tender, BS normal Extremities- no edema or cyanosis Mental Status: Patient is awake, alert, oriented to person, place, month, year, and situation. Patient is able to give a clear and coherent history. No signs of aphasia or neglect Cranial Nerves: II: Visual Fields are full. Pupils are equal, round, and reactive to light.   III,IV, VI: EOMI without ptosis or diploplia.  V: Facial sensation is symmetric to temperature VII: Facial movement is symmetric.  VIII: hearing is intact to voice X: Uvula elevates symmetrically XI: Shoulder shrug is symmetric. XII: tongue is  midline without atrophy or fasciculations.  Motor: Tone is normal. Bulk is normal. 5/5 strength was present on the left side, on the right he has 4/5 strength with ataxia in both the arm and leg. Sensory: Sensation is symmetric to light touch in the arms and legs Cerebellar: He has ataxia out of proportion to weakness on finger-nose-finger on the right, none on the left.  IMAGING: I have personally reviewed the radiological images below and agree with the radiology interpretations.  Ct Head Wo Contrast Result Date: 05/02/2017 IMPRESSION: Normal head CT. Electronically Signed   By: Marijo Conception,  M.D.   On: 05/02/2017 12:44   Mr Brain Wo Contrast (neuro Protocol) Result Date: 05/02/2017 IMPRESSION: 1. Acute lacunar infarct in the left paracentral pons. No associated hemorrhage or mass effect. 2. Otherwise normal for age noncontrast MRI appearance of the brain. Electronically Signed   By: Genevie Ann M.D.   On: 05/02/2017 14:48   US Venous Img Lower Right (dvt Study) Result Date: 05/02/2017 IMPRESSION: No evidence of deep venous thrombosis in right lower extremity. Electronically Signed   By: Marijo Conception, M.D.   On: 05/02/2017 15:09   Mr Jodene Nam Head (cerebral Arteries) Result Date: 05/02/2017 IMPRESSION: 1. Negative posterior circulation. No Basilar Artery irregularity or stenosis. 2. 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 Negative anterior circulation otherwise. Electronically Signed   By: Genevie Ann M.D.   On: 05/02/2017 14:52   Echocardiogram:                                              PENDING CTA Head/Neck:                                                 IMPRESSION: Patient with recent pontine stroke. Evaluation of the posterior circulation shows a 30% stenosis at the left vertebral artery origin but wide patency beyond that through the neck. The right vertebral artery is a small vessel that terminates in PICA. There is atherosclerotic disease of the V4 segment of the left vertebral artery which could relate to the brainstem infarction. Stenosis is estimated at 50% in this segment. The basilar artery shows mild atherosclerotic narrowing without a focal stenosis.  Mild soft plaque at the right carotid bifurcation but without stenosis. Left carotid bifurcation is normal. Atherosclerotic change in both carotid siphon regions. On the right, there is a 50% stenosis of the supraclinoid ICA. On the left, there are 30% stenoses of the ICA at the proximal siphon in the supraclinoid level, and there is mild atherosclerotic ectasia in the siphon itself      IMPRESSION: Douglas Odonnell is a 72 y.o. male with PMH of hypertension who presents with acute onset of right-sided weakness. 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  Resultant Symptoms: right sided deficits Stroke Risk Factors: hyperlipidemia and hypertension Other Stroke Risk Factors: Advanced age, Esophageal cancer  Outstanding Stroke Work-up Studies:     Echocardiogram:  PENDING  PLAN  05/03/2017: Continue Aspirin/ Statin Frequent neuro checks Telemetry monitoring PT/OT/SLP Consult PM & Rehab Consult Case Management /MSW Ongoing aggressive stroke risk factor management Patient counseled to be compliant with his antithrombotic medications Patient counseled on Lifestyle modifications including, Diet, Exercise, and Stress Follow up with [facility] in 6 weeks  INTRACRANIAL STENOSIS Continue ASA and Statin therapy for now  HYPERTENSION: Stable Permissive hypertension (OK if <220/120) for 24-48 hours post stroke and then gradually normalized within 5-7 days. Long term BP goal normotensive. May slowly restart home B/P medications after 48 hours Home Meds: Lisinopril  HYPERLIPIDEMIA:    Component Value Date/Time   CHOL 167 05/03/2017 0421   TRIG 147 05/03/2017 0421   HDL 29 (L) 05/03/2017 0421   CHOLHDL 5.8 05/03/2017 0421   VLDL 29 05/03/2017 0421   LDLCALC 109 (H) 05/03/2017 0421  Home Meds:  NONE LDL  goal < 70 Started on  Crestor 20 mg daily Continue statin at discharge  R/O DIABETES: No results found for: HGBA1C - LABS PENDING HgbA1c goal < 7.0  PRE- OBESITY Obesity, Body mass index is 29.95 kg/m. Greater than/equal to 30  Other Active Problems: Principal Problem:   Right leg weakness Active Problems:   Stroke [facility])    Hospital day # 1 VTE prophylaxis: SCD's  Diet : Fall precautions Diet Heart  Room service appropriate? Yes; Fluid consistency: Thin   FAMILY UPDATES: No family at bedside  TEAM UPDATES: Bonnell Public, MD   Prior Home Stroke Medications:  No antithrombotic  Discharge Stroke Meds:  Please discharge patient on aspirin 325 mg daily   Please call if any negative findings on ECHO or any AFIB noted on telemetry monitoring for [facility] therapy recommendations.  Disposition: 01-Home or Self Care Therapy Recs:               PENDING Follow Up:  Follow-up Information    Dennie Bible, NP. Schedule an appointment as soon as possible for a visit in 6 week(s).   Specialty:  Family Medicine Contact information: [address] [place] [address] [phone]          Octavio Graves, DO -PCP Follow up in 1-2 weeks      Assessment & plan discussed with with attending physician and they are in agreement.    Renie Ora Stroke Neurology Team 05/03/2017 3:43 PM    05/03/2017 ATTENDING ASSESSMENT:   I reviewed above note and agree with the assessment and plan. I have made any additions or clarifications directly to the above note. Pt was seen and examined.   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  CT no acute abnormality.  MRI showed left pontine infarct.  And MRI showed bilateral siphon stenosis.  CTA head and neck showed right ICA supraclinoid segment 50% stenosis, left ICA siphons 30% stenosis, left VA 30% stenosis at origin, and 50% stenosis at left V4.  2D echo pending.  LDL 109 and A1c pending.  Stroke likely due to small vessel disease given the location and risk factors.  Continue aspirin and Crestor for stroke prevention.  Stroke risk factor modification.  PT/OT evaluation.  Neurology will sign off. Please call with questions. Pt will follow up with stroke clinic at [facility] in about 4 weeks. Thanks for the consult.  Rosalin Hawking, MD PhD Stroke  Neurology 05/03/2017 4:18 PM      Neurology to sign-off at this  time. Please call with any further questions or concerns. Thank you for this consultation.  To contact Stroke Continuity provider, please refer to [email or web address] After hours, contact General Neurology

## 2017-05-03 NOTE — Progress Notes (Signed)
Patient admitted to room 5w25, patient alert/oriented x4  Neuro within normal limits, NIH score zero. Denies pain at this time. Family at bedside. Skin intact. Stroke education provided to patient and family. Placed on falls precautions and oriented to room/call bell.

## 2017-05-03 NOTE — Progress Notes (Signed)
PROGRESS NOTE    Douglas Odonnell  FWY:[id number] DOB: 13-Jul-1945 DOA: 05/02/2017 PCP: Octavio Graves, DO  Outpatient Specialists:   Brief Narrative: Patient is a 72 year old Caucasian male with past medical history significant for hypertension, GERD, history of esophageal cancer, and Barrett's esophagus.  Patient was admitted with right-sided weakness and fall that occurred about 3 days prior to presentation.  MRI of the brain done revealed acute lacunar infarct in the left paracentral pons, with no associated hemorrhage or mass-effect.  Patient has been admitted for further assessment and management of acute pontine infarct.  Patient is currently on aspirin.  The neurology input is appreciated.  Assessment & Plan:   Principal Problem: Acute left pontine infarct. Right leg weakness Active Problems:   Stroke ([place])    1.  Acute left pontine infarct:  Carotid ultrasound Cardiac echo Check hga1c, lipid panel Start aspirin Start Crestor RN bedside swallow Permissive hypertension Neurology consulted, discussed case with Dr. Malen Gauze, who will have neurology see when patient arrives at [facility] since no neurology service at [facility] over the weekend Neurology input is appreciated. Continue aspirin. Consult physical therapy.  2.  Hypertension -Optimized.  Acute infarct is at least 4 days out.    3.  BPH: Cont Flomax 0.4mg  po qhs  4.  GERD: Cont H2blocker   5.  Chronic pain: Cont oxycodone prn  6.  Constipation: Cont linzess    DVT Prophylaxis - SCDs  AM Labs Ordered, also please review Full Orders  Family Communication:  Updated patient's brother and sister that were at the bedside during this consultation. Code Status: FULL CODE Disposition: Will depend on physical therapy input.   Condition GUARDED   Consults called: neurology   Subjective: No new complaints. No fever or chills. No chest pain or shortness of breath.  Objective: Vitals:   05/02/17 2000 05/02/17 2015 05/02/17 2138 05/03/17 0538  BP: (!) 163/94  (!) 159/93 (!) 159/89  Pulse: 73 78 77 78  Resp: 14 14 16    Temp:   97.7 F (36.5 C) 98.5 F (36.9 C)  TempSrc:   Oral Oral  SpO2:   99% 97%  Weight:      Height:       No intake or output data in the 24 hours ending 05/03/17 1009 Filed Weights   05/02/17 1217  Weight: 81.6 kg (180 lb)    Examination:  General exam: Appears calm and comfortable  Respiratory system: Clear to auscultation.  Cardiovascular system: S1 & S2.  No pedal edema. Gastrointestinal system: Abdomen is nondistended, soft and nontender. No organomegaly or masses felt. Normal bowel sounds heard. Central nervous system: Alert and oriented.  Right-sided weakness.  Pulmonary right upper and lower extremities 3+/5.   Extremities: No leg edema.    Data Reviewed: I have personally reviewed following labs and imaging studies  CBC: Recent Labs  Lab 05/02/17 1239 05/02/17 1255 05/03/17 0421  WBC 5.3  --  5.8  NEUTROABS 3.5  --   --   HGB 14.0 15.0 14.3  HCT 43.8 44.0 43.2  MCV 85.5  --  85.2  PLT 191  --  277   Basic Metabolic Panel: Recent Labs  Lab 05/02/17 1239 05/02/17 1255 05/03/17 0421  NA 139 139 138  K 4.2 4.0 4.1  CL 104 101 102  CO2 25  --  26  GLUCOSE 106* 103* 110*  BUN 16 15 14   CREATININE 1.08 1.00 1.02  CALCIUM 9.1  --  9.2  GFR: Estimated Creatinine Clearance: 65.3 mL/min (by C-G formula based on SCr of 1.02 mg/dL). Liver Function Tests: Recent Labs  Lab 05/02/17 1239 05/03/17 0421  AST 29 28  ALT 45 27  ALKPHOS 85 77  BILITOT 0.4 0.8  PROT 7.4 6.6  ALBUMIN 4.3 3.8   No results for input(s): LIPASE, AMYLASE in the last 168 hours. No results for input(s): AMMONIA in the last 168 hours. Coagulation Profile: Recent Labs  Lab 05/02/17 1239  INR 1.00   Cardiac Enzymes: No results for input(s): CKTOTAL, CKMB, CKMBINDEX, TROPONINI in the last 168 hours. BNP (last 3 results) No results for  input(s): PROBNP in the last 8760 hours. HbA1C: No results for input(s): HGBA1C in the last 72 hours. CBG: Recent Labs  Lab 05/02/17 1223  GLUCAP 92   Lipid Profile: Recent Labs    05/03/17 0421  CHOL 167  HDL 29*  LDLCALC 109*  TRIG 147  CHOLHDL 5.8   Thyroid Function Tests: Recent Labs    05/03/17 0421  TSH 1.758   Anemia Panel: Recent Labs    05/03/17 0421  VITAMINB12 278   Urine analysis:    Component Value Date/Time   COLORURINE YELLOW 05/02/2017 1926   APPEARANCEUR HAZY (A) 05/02/2017 1926   LABSPEC 1.013 05/02/2017 1926   PHURINE 7.0 05/02/2017 1926   GLUCOSEU NEGATIVE 05/02/2017 1926   GLUCOSEU NEGATIVE 02/07/2015 1123   HGBUR SMALL (A) 05/02/2017 1926   BILIRUBINUR NEGATIVE 05/02/2017 1926   KETONESUR NEGATIVE 05/02/2017 1926   PROTEINUR NEGATIVE 05/02/2017 1926   UROBILINOGEN 1.0 02/07/2015 1123   NITRITE NEGATIVE 05/02/2017 1926   LEUKOCYTESUR LARGE (A) 05/02/2017 1926   Sepsis Labs: @LABRCNTIP (procalcitonin:4,lacticidven:4)  )No results found for this or any previous visit (from the past 240 hour(s)).       Radiology Studies: Ct Angio Head W Or Wo Contrast  Result Date: 05/03/2017 CLINICAL DATA:  Followup brainstem stroke. EXAM: CT ANGIOGRAPHY HEAD AND NECK TECHNIQUE: Multidetector CT imaging of the head and neck was performed using the standard protocol during bolus administration of intravenous contrast. Multiplanar CT image reconstructions and MIPs were obtained to evaluate the vascular anatomy. Carotid stenosis measurements (when applicable) are obtained utilizing NASCET criteria, using the distal internal carotid diameter as the denominator. CONTRAST:  39mL ISOVUE-370 IOPAMIDOL (ISOVUE-370) INJECTION 76% COMPARISON:  MRI and CT 05/02/2017 FINDINGS: CTA NECK FINDINGS Aortic arch: No atherosclerotic calcification, aneurysm or dissection. Branching pattern of the brachiocephalic vessels from the arch is normal. Right carotid system: Common  carotid artery widely patent to the bifurcation region. Mild atherosclerotic plaque at the proximal ICA with minimal diameter of 4.3 mm. Compared to a more distal cervical ICA diameter of 4.5 mm, there is no stenosis. Left carotid system: Common carotid artery widely patent to the bifurcation region. Carotid bifurcation and ICA bulb appear normal. No atherosclerotic plaque, irregularity or narrowing. Vertebral arteries: Left vertebral artery is dominant. No vertebral origin stenosis on the right. 30% narrowing of the left vertebral artery origin. Wide patency beyond that. It is Skeleton: Ordinary mid cervical spondylosis. Other neck: No mass or lymphadenopathy. Upper chest: The lungs are clear. There is a 9 mm lymph node behind the upper trachea, not likely significant as an isolated finding. Review of the MIP images confirms the above findings CTA HEAD FINDINGS Anterior circulation: Atherosclerotic calcification in both carotid siphon regions. On the right, there is stenosis of the supraclinoid ICA of 50%. On the left, the vessel is slightly ectatic in the midportion of the carotid  siphon. There is 30% stenosis at the proximal siphon and in the supraclinoid ICA. Both middle cerebral arteries and anterior cerebral arteries are widely patent and normal. Posterior circulation: Both vertebral arteries are patent at the foramen magnum level. The right vertebral artery is a small vessel that terminates in PICA. The left vertebral artery supplies PICA than show some atherosclerotic calcification in the V4 segment with stenosis of 50%. Beyond that, the vessel is patent to the basilar. The basilar artery shows mild atherosclerotic narrowing and irregularity. Superior cerebellar and posterior cerebral arteries are patent. Right posterior cerebral artery takes a fetal origin from the anterior circulation. Venous sinuses: Patent and normal. Anatomic variants: None significant Delayed phase: No abnormal enhancement Review of  the MIP images confirms the above findings IMPRESSION: Patient with recent pontine stroke. Evaluation of the posterior circulation shows a 30% stenosis at the left vertebral artery origin but wide patency beyond that through the neck. The right vertebral artery is a small vessel that terminates in PICA. There is atherosclerotic disease of the V4 segment of the left vertebral artery which could relate to the brainstem infarction. Stenosis is estimated at 50% in this segment. The basilar artery shows mild atherosclerotic narrowing without a focal stenosis. Mild soft plaque at the right carotid bifurcation but without stenosis. Left carotid bifurcation is normal. Atherosclerotic change in both carotid siphon regions. On the right, there is a 50% stenosis of the supraclinoid ICA. On the left, there are 30% stenoses of the ICA at the proximal siphon in the supraclinoid level, and there is mild atherosclerotic ectasia in the siphon itself. Electronically Signed   By: Nelson Chimes M.D.   On: 05/03/2017 09:08   Dg Chest 2 View  Result Date: 05/02/2017 CLINICAL DATA:  72 year old male with right side weakness for several days. EXAM: CHEST  2 VIEW COMPARISON:  Chest CT 11/02/2014 and earlier. FINDINGS: Lung volumes are within normal limits. Mediastinal contours remain normal. Visualized tracheal air column is within normal limits. No pneumothorax, pulmonary edema, pleural effusion or confluent pulmonary opacity. Mild chronic scoliosis. No acute osseous abnormality identified. Stable cholecystectomy clips. Negative visible bowel gas pattern. IMPRESSION: Negative.  No acute cardiopulmonary abnormality. Electronically Signed   By: Genevie Ann M.D.   On: 05/02/2017 14:40   Ct Head Wo Contrast  Result Date: 05/02/2017 CLINICAL DATA:  Slurred speech, right-sided weakness. EXAM: CT HEAD WITHOUT CONTRAST TECHNIQUE: Contiguous axial images were obtained from the base of the skull through the vertex without intravenous contrast.  COMPARISON:  None. FINDINGS: Brain: No evidence of acute infarction, hemorrhage, hydrocephalus, extra-axial collection or mass lesion/mass effect. Vascular: No hyperdense vessel or unexpected calcification. Skull: Normal. Negative for fracture or focal lesion. Sinuses/Orbits: No acute finding. Other: None. IMPRESSION: Normal head CT. Electronically Signed   By: Marijo Conception, M.D.   On: 05/02/2017 12:44   Ct Angio Neck W Or Wo Contrast  Result Date: 05/03/2017 CLINICAL DATA:  Followup brainstem stroke. EXAM: CT ANGIOGRAPHY HEAD AND NECK TECHNIQUE: Multidetector CT imaging of the head and neck was performed using the standard protocol during bolus administration of intravenous contrast. Multiplanar CT image reconstructions and MIPs were obtained to evaluate the vascular anatomy. Carotid stenosis measurements (when applicable) are obtained utilizing NASCET criteria, using the distal internal carotid diameter as the denominator. CONTRAST:  5mL ISOVUE-370 IOPAMIDOL (ISOVUE-370) INJECTION 76% COMPARISON:  MRI and CT 05/02/2017 FINDINGS: CTA NECK FINDINGS Aortic arch: No atherosclerotic calcification, aneurysm or dissection. Branching pattern of the brachiocephalic vessels from the  arch is normal. Right carotid system: Common carotid artery widely patent to the bifurcation region. Mild atherosclerotic plaque at the proximal ICA with minimal diameter of 4.3 mm. Compared to a more distal cervical ICA diameter of 4.5 mm, there is no stenosis. Left carotid system: Common carotid artery widely patent to the bifurcation region. Carotid bifurcation and ICA bulb appear normal. No atherosclerotic plaque, irregularity or narrowing. Vertebral arteries: Left vertebral artery is dominant. No vertebral origin stenosis on the right. 30% narrowing of the left vertebral artery origin. Wide patency beyond that. It is Skeleton: Ordinary mid cervical spondylosis. Other neck: No mass or lymphadenopathy. Upper chest: The lungs are  clear. There is a 9 mm lymph node behind the upper trachea, not likely significant as an isolated finding. Review of the MIP images confirms the above findings CTA HEAD FINDINGS Anterior circulation: Atherosclerotic calcification in both carotid siphon regions. On the right, there is stenosis of the supraclinoid ICA of 50%. On the left, the vessel is slightly ectatic in the midportion of the carotid siphon. There is 30% stenosis at the proximal siphon and in the supraclinoid ICA. Both middle cerebral arteries and anterior cerebral arteries are widely patent and normal. Posterior circulation: Both vertebral arteries are patent at the foramen magnum level. The right vertebral artery is a small vessel that terminates in PICA. The left vertebral artery supplies PICA than show some atherosclerotic calcification in the V4 segment with stenosis of 50%. Beyond that, the vessel is patent to the basilar. The basilar artery shows mild atherosclerotic narrowing and irregularity. Superior cerebellar and posterior cerebral arteries are patent. Right posterior cerebral artery takes a fetal origin from the anterior circulation. Venous sinuses: Patent and normal. Anatomic variants: None significant Delayed phase: No abnormal enhancement Review of the MIP images confirms the above findings IMPRESSION: Patient with recent pontine stroke. Evaluation of the posterior circulation shows a 30% stenosis at the left vertebral artery origin but wide patency beyond that through the neck. The right vertebral artery is a small vessel that terminates in PICA. There is atherosclerotic disease of the V4 segment of the left vertebral artery which could relate to the brainstem infarction. Stenosis is estimated at 50% in this segment. The basilar artery shows mild atherosclerotic narrowing without a focal stenosis. Mild soft plaque at the right carotid bifurcation but without stenosis. Left carotid bifurcation is normal. Atherosclerotic change in both  carotid siphon regions. On the right, there is a 50% stenosis of the supraclinoid ICA. On the left, there are 30% stenoses of the ICA at the proximal siphon in the supraclinoid level, and there is mild atherosclerotic ectasia in the siphon itself. Electronically Signed   By: Nelson Chimes M.D.   On: 05/03/2017 09:08   Mr Brain Wo Contrast (neuro Protocol)  Result Date: 05/02/2017 CLINICAL DATA:  72 year old male with right side weakness, headache and facial droop since yesterday. Slurred speech. Personal history of esophageal cancer. EXAM: MRI HEAD WITHOUT CONTRAST TECHNIQUE: Multiplanar, multiecho pulse sequences of the brain and surrounding structures were obtained without intravenous contrast. COMPARISON:  Head CT without contrast 1223 hr today. FINDINGS: Brain: There is a 12 millimeter wedge-shaped area of restricted diffusion in the left paracentral pons (series 3, image 67) with faint associated T2 and FLAIR hyperintensity. No associated hemorrhage or mass effect. No other restricted diffusion. Outside of the pons gray and white matter signal is normal for age. No cortical encephalomalacia or chronic cerebral blood products. No midline shift, mass effect, evidence of mass lesion,  ventriculomegaly, extra-axial collection or acute intracranial hemorrhage. Cervicomedullary junction and pituitary are within normal limits. Vascular: Major intracranial vascular flow voids are preserved, the distal left vertebral artery is dominant. Skull and upper cervical spine: Negative visible cervical spine. Normal bone marrow signal. Sinuses/Orbits: Normal orbits soft tissues. Mild bilateral ethmoid sinus mucosal thickening with trace paranasal sinus mucosal thickening otherwise. Other: Mastoid air cells are clear. Visible internal auditory structures appear normal. Scalp and face soft tissues appear negative. IMPRESSION: 1. Acute lacunar infarct in the left paracentral pons. No associated hemorrhage or mass effect. 2.  Otherwise normal for age noncontrast MRI appearance of the brain. Electronically Signed   By: Genevie Ann M.D.   On: 05/02/2017 14:48   US Venous Img Lower Right (dvt Study)  Result Date: 05/02/2017 CLINICAL DATA:  Acute right calf pain. EXAM: Right LOWER EXTREMITY VENOUS DOPPLER ULTRASOUND TECHNIQUE: Gray-scale sonography with graded compression, as well as color Doppler and duplex ultrasound were performed to evaluate the lower extremity deep venous systems from the level of the common femoral vein and including the common femoral, femoral, profunda femoral, popliteal and calf veins including the posterior tibial, peroneal and gastrocnemius veins when visible. The superficial great saphenous vein was also interrogated. Spectral Doppler was utilized to evaluate flow at rest and with distal augmentation maneuvers in the common femoral, femoral and popliteal veins. COMPARISON:  None. FINDINGS: Contralateral Common Femoral Vein: Respiratory phasicity is normal and symmetric with the symptomatic side. No evidence of thrombus. Normal compressibility. Common Femoral Vein: No evidence of thrombus. Normal compressibility, respiratory phasicity and response to augmentation. Saphenofemoral Junction: No evidence of thrombus. Normal compressibility and flow on color Doppler imaging. Profunda Femoral Vein: No evidence of thrombus. Normal compressibility and flow on color Doppler imaging. Femoral Vein: No evidence of thrombus. Normal compressibility, respiratory phasicity and response to augmentation. Popliteal Vein: No evidence of thrombus. Normal compressibility, respiratory phasicity and response to augmentation. Calf Veins: No evidence of thrombus. Normal compressibility and flow on color Doppler imaging. Superficial Great Saphenous Vein: No evidence of thrombus. Normal compressibility. Venous Reflux:  None. Other Findings:  None. IMPRESSION: No evidence of deep venous thrombosis in right lower extremity. Electronically  Signed   By: Marijo Conception, M.D.   On: 05/02/2017 15:09   Mr Virgel Paling (cerebral Arteries)  Result Date: 05/02/2017 CLINICAL DATA:  72 year old male with acute pontine lacunar infarct on brain MRI today. Right side weakness, headache and facial droop since yesterday. Slurred speech. Personal history of esophageal cancer. EXAM: MRA HEAD WITHOUT CONTRAST TECHNIQUE: Angiographic images of the Circle of Willis were obtained using MRA technique without intravenous contrast. COMPARISON:  Brain MRI today reported separately. FINDINGS: Antegrade flow in the posterior circulation with dominant distal left vertebral artery. The non dominant distal right vertebral artery terminates in the right PICA. Patent left PICA origin. No distal left vertebral artery stenosis. The left vertebral supplies the basilar. There is no basilar artery irregularity or stenosis. The SCA and left PCA origins are normal. There is a fetal type right PCA origin. Bilateral PCA branches are within normal limits. Antegrade flow in both ICA siphons. Mild bilateral siphon irregularity. No significant ICA siphon stenosis. Normal ophthalmic and right posterior communicating artery origins. The left posterior communicating artery is diminutive or absent. Patent carotid termini. Normal MCA and ACA origins. Diminutive anterior communicating artery. Bilateral ACA branches are within normal limits. Left MCA M1 segment, MCA bifurcation and visible left MCA branches are within normal limits. Right MCA M1 segment, bifurcation,  and visible right MCA branches are within normal limits. IMPRESSION: 1. Negative posterior circulation. No Basilar Artery irregularity or stenosis. 2. 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 Negative anterior circulation otherwise. Electronically Signed   By: Genevie Ann M.D.   On: 05/02/2017 14:52        Scheduled Meds: . aspirin  300 mg Rectal Daily   Or  . aspirin  325 mg Oral Daily  . famotidine  10 mg Oral BID    . linaclotide  290 mcg Oral Daily  . lisinopril  10 mg Oral Daily  . rosuvastatin  20 mg Oral q1800  . tamsulosin  0.4 mg Oral Daily   Continuous Infusions:   LOS: 1 day    Time spent: 35 minutes.    Dana Allan, MD  [facility] Pager #: [phone] 7PM-7AM contact night coverage as above

## 2017-05-03 NOTE — Evaluation (Signed)
Physical Therapy Evaluation Patient Details Name: Douglas Odonnell MRN: [id number] DOB: 1946/02/02 Today's Date: 05/03/2017   History of Present Illness  Douglas Odonnell is a 72 y.o. male with a history of hypertension who presents with right-sided weakness.  He states that it started likely on Tuesday, but is not sure.  He noticed it being persistent, though it is slightly improved he states.  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 All education has been completed and the patient has no further questions. PTA, patient independent with all mobility, living with family in 1 story home who are avail for 24 hour support. Upon eval, patient presents with mild weakness and balance deficitions as well as impaired coordination. Requires physical guarding during ambulation without AD, however progressed to supervision level with RW. Pt feels he is "close" to baseline and wants to go home once medically cleared. Pt would benefit from OP Neuro PT to maximize balance and return to functional baseline.  Family/pt agreeable to supervise patient and educated on safe guarding. See below for any follow-up Physical Therapy or equipment needs. PT is signing off. Thank you for this referral.      Follow Up Recommendations Outpatient PT;Supervision for mobility/OOB   (OP NEURO PT )    Equipment Recommendations  None recommended by PT    Recommendations for Other Services       Precautions / Restrictions Precautions Precautions: Fall Restrictions Weight Bearing Restrictions: No      Mobility  Bed Mobility Overal bed mobility: Modified Independent                Transfers Overall transfer level: Needs assistance Equipment used: None Transfers: Sit to/from Stand Sit to Stand: Supervision          General transfer comment: Supervision for safety. Pt tolerates standing without assistance but requires min guard with dynamic mobility.   Ambulation/Gait Ambulation/Gait assistance: Min guard;Supervision Ambulation Distance (Feet): 400 Feet Assistive device: Rolling walker (2 wheeled);None Gait Pattern/deviations: Step-through pattern;Ataxic;Narrow base of support Gait velocity: normal   General Gait Details: mild ataxia, mild balance deficits. Min guard to stabilize sway during walking without AD. patient progressed to supervision level with RW and had no LOB. Family present and involved, feel comfortable supervising and guarding.   Stairs Stairs: Yes Stairs assistance: Min guard Stair Management: One rail Left;Forwards;One rail Right;Alternating pattern;Step to pattern Number of Stairs: 24 General stair comments: Patient safer with step-to-gait. Educated family on proper guarding and postioninng. Pt with no LOB but can not progress safely to supervison this visit. Min guard.   Wheelchair Mobility    Modified Rankin (Stroke Patients Only) Modified Rankin (Stroke Patients Only) Pre-Morbid Rankin Score: No symptoms Modified Rankin: Slight disability     Balance Overall balance assessment: Needs assistance Sitting-balance support: Feet unsupported;No upper extremity supported Sitting balance-Leahy Scale: Good     Standing balance support: During functional activity;No upper extremity supported;Bilateral upper extremity supported Standing balance-Leahy Scale: Fair Standing balance comment: Safer with RW for dynamic balance. Can tolerate static standing with moderate challange                             Pertinent Vitals/Pain Pain Assessment: No/denies pain    Home Living Family/patient expects to be discharged to:: Private  residence Living Arrangements: Other relatives Available Help at Discharge: Family;Available 24 hours/day Type of Home: House Home  Access: Stairs to enter Entrance Stairs-Rails: Left Entrance Stairs-Number of Steps: 1 Home Layout: One level Home Equipment: None;Wheelchair - [employer] - 2 wheels;Cane - single point      Prior Function Level of Independence: Independent               Hand Dominance   Dominant Hand: Right    Extremity/Trunk Assessment   Upper Extremity Assessment Upper Extremity Assessment: Defer to OT evaluation;Overall [facility] for tasks assessed(RUE slightly weaker than LUE)    Lower Extremity Assessment Lower Extremity Assessment: (BLE gross 4/5. WNL light touch. )    Cervical / Trunk Assessment Cervical / Trunk Assessment: Normal  Communication   Communication: No difficulties  Cognition Arousal/Alertness: Awake/alert Behavior During Therapy: WFL for tasks assessed/performed Overall Cognitive Status: Within Functional Limits for tasks assessed                                        General Comments      Exercises     Assessment/Plan    PT Assessment All further PT needs can be met in the next venue of care  PT Problem List Decreased strength;Decreased balance;Decreased coordination       PT Treatment Interventions      PT Goals (Current goals can be found in the Care Plan section)  Acute Rehab PT Goals Patient Stated Goal: Return home PT Goal Formulation: With patient/family Time For Goal Achievement: 05/05/17 Potential to Achieve Goals: Good    Frequency     Barriers to discharge        Co-evaluation               AM-PAC PT "6 Clicks" Daily Activity  Outcome Measure Difficulty turning over in bed (including adjusting bedclothes, sheets and blankets)?: None Difficulty moving from lying on back to sitting on the side of the bed? : None Difficulty sitting down on and standing up from a chair with arms (e.g., wheelchair, bedside commode, etc,.)?: None Help needed moving to and from a bed to chair (including a wheelchair)?: None Help  needed walking in hospital room?: A Little Help needed climbing 3-5 steps with a railing? : A Little 6 Click Score: 22    End of Session Equipment Utilized During Treatment: Gait belt Activity Tolerance: Patient tolerated treatment well Patient left: in bed;with call bell/phone within reach;with family/visitor present Nurse Communication: Mobility status PT Visit Diagnosis: Unsteadiness on feet (R26.81);Other symptoms and signs involving the nervous system (R29.898);Difficulty in walking, not elsewhere classified (R26.2)    Time: 1705-1730 PT Time Calculation (min) (ACUTE ONLY): 25 min   Charges:   PT Evaluation $PT Eval Low Complexity: 1 Low PT Treatments $Gait Training: 8-22 mins   PT G Codes:        Reinaldo Berber, PT, DPT Acute Rehab Services Pager: [phone]    Reinaldo Berber 05/03/2017, 5:42 PM

## 2017-05-03 NOTE — Consult Note (Signed)
Neurology Consultation Reason for Consult: Stroke Referring Physician: Georges Mouse  CC: Right-sided weakness  History is obtained from: Patient  HPI: Douglas Odonnell is a 72 y.o. male with a history of hypertension who presents with right-sided weakness.  He states that it started likely on Tuesday, but is not sure.  He noticed it being persistent, though it is slightly improved he states.  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   LKW: Tuesday tpa given?: no, out of window    ROS: A 14 point ROS was performed and is negative except as noted in the HPI.   Past Medical History:  Diagnosis Date  . Barrett esophagus   . Chronic abdominal pain   . Esophageal cancer ([place])   . GERD (gastroesophageal reflux disease)   . H. pylori infection   . Hiatal hernia   . Hypertension   . Renal calculus   . Senile osteoporosis      Family History  Problem Relation Age of Onset  . Diabetes Father   . Heart disease Father   . Prostate cancer Father   . Diabetes Mother   . Kidney failure Mother   . Gout Mother   . Kidney disease Mother   . Diabetes Sister        twin     Social History:  reports that  has never smoked. he has never used smokeless tobacco. He reports that he does not drink alcohol or use drugs.   Exam: Current vital signs: BP (!) 159/93 (BP Location: Right Arm)   Pulse 77   Temp 97.7 F (36.5 C) (Oral)   Resp 16   Ht 5\' 5"  (1.651 m)   Wt 81.6 kg (180 lb)   SpO2 99%   BMI 29.95 kg/m  Vital signs in last 24 hours: Temp:  [97.5 F (36.4 C)-97.9 F (36.6 C)] 97.7 F (36.5 C) (02/22 2138) Pulse Rate:  [73-89] 77 (02/22 2138) Resp:  [10-18] 16 (02/22 2138) BP: (159-174)/(91-101) 159/93 (02/22 2138) SpO2:  [96 %-100 %] 99 % (02/22 2138) Weight:  [81.6 kg (180 lb)] 81.6 kg (180 lb) (02/22 1217)   Physical Exam  Constitutional: Appears well-developed and  well-nourished.  Psych: Affect appropriate to situation Eyes: No scleral injection HENT: No OP obstrucion Head: Normocephalic.  Cardiovascular: Normal rate and regular rhythm.  Respiratory: Effort normal, non-labored breathing GI: Soft.  No distension. There is no tenderness.  Skin: WDI  Neuro: Mental Status: Patient is awake, alert, oriented to person, place, month, year, and situation. Patient is able to give a clear and coherent history. No signs of aphasia or neglect Cranial Nerves: II: Visual Fields are full. Pupils are equal, round, and reactive to light.   III,IV, VI: EOMI without ptosis or diploplia.  V: Facial sensation is symmetric to temperature VII: Facial movement is symmetric.  VIII: hearing is intact to voice X: Uvula elevates symmetrically XI: Shoulder shrug is symmetric. XII: tongue is midline without atrophy or fasciculations.  Motor: Tone is normal. Bulk is normal. 5/5 strength was present on the left side, on the right he has 4/5 strength with ataxia in both the arm and leg. Sensory: Sensation is symmetric to light touch in the arms and legs Cerebellar: He has ataxia out of proportion to weakness on finger-nose-finger on the right, none on the left.  I have reviewed labs in epic and the results pertinent to this consultation  are: CMP-unremarkable  I have reviewed the images obtained: MRI brain- pontine perforator infarct.  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  He is being admitted for secondary risk factor modification and therapy.   Recommendations: 1. HgbA1c, fasting lipid panel 2. Frequent neuro checks 3. Echocardiogram 4. Carotid dopplers 5. Prophylactic therapy-Antiplatelet SQZ:YTMMITV - dose 325mg  PO or 300mg  PR 7. Risk factor modification 8. Telemetry monitoring 9. PT consult, OT consult, Speech consult 10. please page stroke NP  Or  PA  Or MD  from 8am -4 pm as this patient will be followed by the stroke team  at this point.   You can look them up on [email or web address]     Roland Rack, MD [facility] [phone]  If 7pm- 7am, please page neurology on call as listed in [place].

## 2017-05-04 ENCOUNTER — Inpatient Hospital Stay (HOSPITAL_COMMUNITY): Payer: Medicare Other

## 2017-05-04 DIAGNOSIS — I351 Nonrheumatic aortic (valve) insufficiency: Secondary | ICD-10-CM

## 2017-05-04 DIAGNOSIS — I6381 Other cerebral infarction due to occlusion or stenosis of small artery: Secondary | ICD-10-CM | POA: Diagnosis not present

## 2017-05-04 DIAGNOSIS — I639 Cerebral infarction, unspecified: Secondary | ICD-10-CM | POA: Diagnosis not present

## 2017-05-04 DIAGNOSIS — R29898 Other symptoms and signs involving the musculoskeletal system: Secondary | ICD-10-CM | POA: Diagnosis not present

## 2017-05-04 DIAGNOSIS — I340 Nonrheumatic mitral (valve) insufficiency: Secondary | ICD-10-CM

## 2017-05-04 LAB — ECHOCARDIOGRAM COMPLETE
Height: 65 in
Weight: 2880 oz

## 2017-05-04 LAB — HEMOGLOBIN A1C
HEMOGLOBIN A1C: 6 % — AB (ref 4.8–5.6)
MEAN PLASMA GLUCOSE: 126 mg/dL

## 2017-05-04 MED ORDER — ASPIRIN 325 MG PO TABS: 325.0000 mg | ORAL_TABLET | Freq: Every day | ORAL | 0 refills | Status: DC

## 2017-05-04 MED ORDER — ROSUVASTATIN CALCIUM 20 MG PO TABS: 20.0000 mg | ORAL_TABLET | Freq: Every day | ORAL | 0 refills | Status: DC

## 2017-05-04 NOTE — Progress Notes (Signed)
  Echocardiogram 2D Echocardiogram has been performed.  Gowri Suchan T Ranada Vigorito 05/04/2017, 11:09 AM

## 2017-05-04 NOTE — Progress Notes (Signed)
OT NOTE  Pt noted to limp with R LE and c/o pain in the calf area. R leg feels the same as L LE in temperature. Pt reports "it feels bruised in there."    Jeri Modena   OTR/L Pager: [phone] Office: [phone] .

## 2017-05-04 NOTE — Discharge Summary (Signed)
Physician Discharge Summary  Patient ID: Douglas Odonnell MRN: [id number] DOB/AGE: 12-May-1945 72 y.o.  Admit date: 05/02/2017 Discharge date: 05/04/2017  Admission Diagnoses:  Discharge Diagnoses:  Principal Problem:   Right leg weakness Active Problems:   Stroke [facility])   Discharged Condition: stable  Brief Narrative: Patient is a 72 year old Caucasian male with past medical history significant for hypertension, GERD, history of esophageal cancer, and Barrett's esophagus.  Patient was admitted with right-sided weakness and fall that occurred about 3 days prior to presentation.  MRI of the brain done revealed acute lacunar infarct in the left paracentral pons, with no associated hemorrhage or mass-effect.  Patient was admitted for further assessment and management of acute pontine infarct.  Patient was started on aspirin 325 mg p.o. once daily by the neurology team.  Acute CVA was worked up with MRI of the brain, CT Angie of the head and neck.  The patient also had a Doppler ultrasound of the lower extremity that was negative for deep venous thrombosis.  Hospital Course:  1.  Acute left pontine infarct: Please see above.  Physical therapy was also consulted.  The patient will be discharged back home on outpatient neuro physical therapy.  Right-sided weakness is already improving.  Patient is eager to be discharged back home.  2.  Hypertension: This is optimize during the hospital stay.  On presentation, the patient was already 3-4 days out of the acute CVA.  Blood pressure was optimized during the hospital stay.    3.  BPH: Cont Flomax 0.4mg  po qhs  4.  GERD: Cont H2blocker   5.  Chronic pain: Cont oxycodone prn  6.  Constipation: Cont linzess  Consults: neurology  Significant Diagnostic Studies: radiology: MRI brain revealed acute lacunar infarct in the left paracentral pons. No associated hemorrhage or mass effect. -CT angiogram of the head and neck revealed: "Posterior  circulation shows a 30% stenosis at the left vertebral artery origin but wide patency beyond that through the neck. The right vertebral artery is a small vessel that terminates in PICA. There is atherosclerotic disease of the V4 segment of the left vertebral artery which could relate to the brainstem infarction. Stenosis is estimated at 50% in this segment. The basilar artery shows mild atherosclerotic narrowing without a focal stenosis".  "Mild soft plaque at the right carotid bifurcation but without stenosis. Left carotid bifurcation is normal. Atherosclerotic change in both carotid siphon regions. On the right, there is a 50% stenosis of the supraclinoid ICA. On the left, there are 30% stenoses of the ICA at the proximal siphon in the supraclinoid level, and there is mild atherosclerotic ectasia in the siphon Itself".  Discharge Exam: Blood pressure 135/87, pulse 78, temperature 97.9 F (36.6 C), resp. rate 16, height 5\' 5"  (1.651 m), weight 81.6 kg (180 lb), SpO2 98 %.   Disposition: 01-Home or Self Care  Discharge Instructions    Ambulatory referral to Neurology   Complete by:  As directed    Follow with stroke clinic NP in 4 weeks   Call MD for:   Complete by:  As directed    Please call MD if symptoms worsen   Diet - low sodium heart healthy   Complete by:  As directed    Increase activity slowly   Complete by:  As directed      Allergies as of 05/04/2017   No Known Allergies     Medication List    STOP taking these medications   ALEVE 220 MG  tablet Generic drug:  naproxen sodium   promethazine 25 MG tablet Commonly known as:  PHENERGAN   sucralfate 1 GM/10ML suspension Commonly known as:  CARAFATE     TAKE these medications   amitriptyline 50 MG tablet Commonly known as:  ELAVIL TAKE ONE TABLET BY MOUTH ONCE DAILY AT BEDTIME (DISCONTINUE  AMITRIPTYLINE  25MG )   aspirin 325 MG tablet Take 1 tablet (325 mg total) by mouth daily. Start taking on:  05/05/2017    linaclotide 290 MCG Caps capsule Commonly known as:  LINZESS Take 1 capsule (290 mcg total) by mouth daily.   lisinopril 10 MG tablet Commonly known as:  PRINIVIL,ZESTRIL Take 10 mg by mouth.   oxyCODONE 15 MG immediate release tablet Commonly known as:  ROXICODONE Take 1 tablet by mouth every 5 (five) hours as needed.   ranitidine 300 MG tablet Commonly known as:  ZANTAC Take 300 mg by mouth at bedtime.   rosuvastatin 20 MG tablet Commonly known as:  CRESTOR Take 1 tablet (20 mg total) by mouth daily at 6 PM.   tamsulosin 0.4 MG Caps capsule Commonly known as:  FLOMAX Take 0.4 mg by mouth daily.   Vitamin D (Ergocalciferol) 2000 units Caps Take 1 capsule by mouth daily.      Follow-up Information    Venancio Poisson, NP. Schedule an appointment as soon as possible for a visit in 4 week(s).   Specialty:  Nurse Practitioner Contact information: [address] [place] [address] [phone]          31 minutes spent discharging this patient.  SignedBonnell Public 05/04/2017, 12:59 PM

## 2017-05-04 NOTE — Evaluation (Signed)
Occupational Therapy Evaluation Patient Details Name: Douglas Odonnell MRN: [id number] DOB: Jan 19, 1946 Today's Date: 05/04/2017    History of Present Illness  72 y.o. male with a history of hypertension who presents with right-sided weakness. + MRI  pontine infarct   Clinical Impression   PT admitted with Pontine infarct. Pt currently with functional limitiations due to the deficits listed below (see OT problem list). Pt currently demonstrates fall risk with pain in R LE. Pt educated on weight bearing in R hand and using R hand for functional task to help with neuro education. Pt starting to familiar L hand and avoiding R per family. Pt and family educated on task to complete with R hand for home exercises.  Pt will benefit from skilled OT to increase their independence and safety with adls and balance to allow discharge outpatient. Sister requesting facility near his home and advised to discuss with CM.     Follow Up Recommendations  Outpatient OT(balance)    Equipment Recommendations  None recommended by OT    Recommendations for Other Services       Precautions / Restrictions Precautions Precautions: Fall Restrictions Weight Bearing Restrictions: No      Mobility Bed Mobility Overal bed mobility: Modified Independent                Transfers Overall transfer level: Needs assistance Equipment used: None Transfers: Sit to/from Stand Sit to Stand: Supervision         General transfer comment: pt favoring the L side     Balance Overall balance assessment: Needs assistance Sitting-balance support: No upper extremity supported;Feet supported Sitting balance-Leahy Scale: Good       Standing balance-Leahy Scale: Fair                 High Level Balance Comments: pt is able to back up, pt does not change speeds to faster or slower. pt maintains a steady same speed. pt heavily favors L LE. pt reports pain at the calf this session. pt at incr fall risk at this  time Standardized Balance Assessment Standardized Balance Assessment : Dynamic Gait Index   Dynamic Gait Index Level Surface: Mild Impairment Change in Gait Speed: Severe Impairment Gait with Horizontal Head Turns: Moderate Impairment Gait with Vertical Head Turns: Moderate Impairment     ADL either performed or assessed with clinical judgement   ADL Overall ADL's : Needs assistance/impaired   Eating/Feeding Details (indicate cue type and reason): family reporting he is using his L hand only. educated on positioning and use of R hand to help with recovery Grooming: Minimal assistance   Upper Body Bathing: Minimal assistance               Toilet Transfer: Supervision/safety       Tub/ Shower Transfer: Tub bench;Moderate assistance Tub/Shower Transfer Details (indicate cue type and reason): educated on use of a robe and need for (A) for shower transfer initially to [place]. pt rolling eyes in response but verbalized I understand Functional mobility during ADLs: Min guard General ADL Comments: pt with noticeable limp on the right with decr ability to control speed walking     Vision Baseline Vision/History: Wears glasses       Perception     Praxis      Pertinent Vitals/Pain Pain Assessment: 0-10 Pain Score: 7  Pain Location: R calf Pain Descriptors / Indicators: Sore;Aching Pain Intervention(s): Monitored during session;Premedicated before session;Repositioned     Hand Dominance Right  Extremity/Trunk Assessment Upper Extremity Assessment Upper Extremity Assessment: RUE deficits/detail RUE Deficits / Details: decr fine motor and gross motor, pt able to make "ok" and "2 fingers"pt is not able to make a tight grasp or to place pad to thumb past second digit RUE Sensation: decreased light touch   Lower Extremity Assessment Lower Extremity Assessment: Defer to PT evaluation   Cervical / Trunk Assessment Cervical / Trunk Assessment: Normal    Communication Communication Communication: No difficulties   Cognition Arousal/Alertness: Awake/alert Behavior During Therapy: WFL for tasks assessed/performed Overall Cognitive Status: Within Functional Limits for tasks assessed                                     General Comments       Exercises Exercises: Other exercises Other Exercises Other Exercises: hand exercises- educated on use of putty, folding clothes, using it for self feeding, open close, adduction, abduction, . sister reports having squeeze ball at home and educated on the use of each digit strengthening on the ball not just a gross hand open/close.    Shoulder Instructions      Home Living Family/patient expects to be discharged to:: Private residence Living Arrangements: Other relatives Available Help at Discharge: Family;Available 24 hours/day Type of Home: House Home Access: Stairs to enter [employer] of Steps: 1 Entrance Stairs-Rails: Left Home Layout: One level     Bathroom Shower/Tub: Teacher, early years/pre: Standard     Home Equipment: None;Wheelchair - [employer] - 2 wheels;Cane - single point   Additional Comments: has a rottweiler and jack russel in the home      Prior Functioning/Environment Level of Independence: Independent        Comments: has x2 sisters and 6 brothers (including his twin) to (A) upon d/c. Sisters already help regularly        OT Problem List: Decreased strength;Decreased activity tolerance;Decreased range of motion;Impaired balance (sitting and/or standing);Impaired vision/perception;Decreased coordination;Impaired sensation;Impaired UE functional use;Pain      OT Treatment/Interventions: Self-care/ADL training;Therapeutic exercise;Neuromuscular education;DME and/or AE instruction;Energy conservation;Therapeutic activities;Cognitive remediation/compensation;Visual/perceptual remediation/compensation;Patient/family  education;Balance training    OT Goals(Current goals can be found in the care plan section) Acute Rehab OT Goals Patient Stated Goal: Return home OT Goal Formulation: With patient Time For Goal Achievement: 05/18/17 Potential to Achieve Goals: Good  OT Frequency: Min 2X/week   Barriers to D/C:            Co-evaluation              AM-PAC PT "6 Clicks" Daily Activity     Outcome Measure Help from another person eating meals?: None Help from another person taking care of personal grooming?: None Help from another person toileting, which includes using toliet, bedpan, or urinal?: None Help from another person bathing (including washing, rinsing, drying)?: None Help from another person to put on and taking off regular upper body clothing?: None Help from another person to put on and taking off regular lower body clothing?: A Little 6 Click Score: 23   End of Session Equipment Utilized During Treatment: Gait belt Nurse Communication: Mobility status;Precautions  Activity Tolerance: Patient tolerated treatment well Patient left: in bed;with call bell/phone within reach;with family/visitor present  OT Visit Diagnosis: Unsteadiness on feet (R26.81);Muscle weakness (generalized) (M62.81)                Time: 1607-3710 OT Time Calculation (min): 22  min Charges:  OT General Charges $OT Visit: 1 Visit OT Evaluation $OT Eval Moderate Complexity: 1 Mod G-Codes:      Jeri Modena   OTR/L Pager: [phone] Office: [phone] .   Parke Poisson B 05/04/2017, 11:06 AM

## 2017-05-04 NOTE — Progress Notes (Signed)
Pt given discharge instructions, prescriptions, and care notes. Pt verbalized understanding AEB no further questions or concerns at this time. IV was discontinued, no redness, pain, or swelling noted at this time. Telemetry discontinued and Centralized Telemetry was notified. Pt left the floor via wheelchair with staff in stable condition. 

## 2017-06-03 ENCOUNTER — Encounter: Payer: Self-pay | Admitting: Adult Health

## 2017-06-03 ENCOUNTER — Ambulatory Visit (INDEPENDENT_AMBULATORY_CARE_PROVIDER_SITE_OTHER): Payer: Medicare Other | Admitting: Adult Health

## 2017-06-03 ENCOUNTER — Encounter (INDEPENDENT_AMBULATORY_CARE_PROVIDER_SITE_OTHER): Payer: Self-pay

## 2017-06-03 VITALS — HR 65 | Ht 65.0 in | Wt 177.0 lb

## 2017-06-03 DIAGNOSIS — E785 Hyperlipidemia, unspecified: Secondary | ICD-10-CM | POA: Diagnosis not present

## 2017-06-03 DIAGNOSIS — I6302 Cerebral infarction due to thrombosis of basilar artery: Secondary | ICD-10-CM | POA: Diagnosis not present

## 2017-06-03 DIAGNOSIS — I10 Essential (primary) hypertension: Secondary | ICD-10-CM

## 2017-06-03 DIAGNOSIS — I639 Cerebral infarction, unspecified: Secondary | ICD-10-CM

## 2017-06-03 NOTE — Progress Notes (Signed)
[facility] [address] [place]. [place] [address] [phone]       OFFICE FOLLOW UP NOTE  Mr. Douglas Odonnell Date of Birth:  [birth year] Medical Record Number:  [id number]   Reason for Referral:  hospital stroke follow up  CHIEF COMPLAINT:  Chief Complaint  Patient presents with  . Follow-up    Hospital Stroke follow up     HPI: Douglas Odonnell is being seen today for initial visit in the office for left lacunar infarct on 05/02/17. History obtained from patient  and chart review. Reviewed all radiology images and labs personally.  Douglas Odonnell a 72 y.o.malewith a history of hypertension who presented with right-sided weakness on 05/02/17. He stated that it started likely on Tuesday (04/29/17), but is not sure. He noticed it being persistent, though it had slightly improved.Due to the persistent symptoms, he sought care in the [facility] emergency department. 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 CT scan reviewed which was normal. MRI head reviewed which showed acute lacunar infarct in the left paracentral pons without hemorrhage or mass effect. MRA head negative for stenosis. Patient transferred to [facility] for further neurology/stroke work up.  CTA head and neck obtained once at [facility] and showed 30% stenosis of left vertebral artery, 50% stenosis of the right supraclinoid ICA, and 30% stenosis of the left ICA. 2D echo showed an EF of 60-65%. LDL 109 and A1c 6.0. No antithrombotic medication PTA and recommended starting aspirin 325mg  daily. Also recommended Crestor 20mg . Patient discharged home in stable condition.   Since discharge, patient has been doing well and is accompanied today by his 2 sisters.  Continues to take aspirin without side effects of increased bleeding or bruising.  Continues to take Crestor without side effects of myalgias.  Blood pressure elevated at 170/98 and rechecked towards the end of visit for 150/92.  Patient does not check blood pressure  at home but states he is able to start.  Patient refusing any type of therapy but does state that he does his own therapies at home such as exercising and using a stress ball for fine finger movements.  He states he has mild right-sided weakness and it has greatly improved.  Does state that he has excessive daytime sleepiness and naps easily.  Explained OSA and how this can have effect on stroke, blood pressure and cholesterol along with increased risk for heart disease but patient refusing to have sleep study done at this time.  Declines participation in [place]  trial.  Denies new or worsening stroke/TIA symptoms.    ROS:   14 system review of systems performed and negative with exception of fatigue, hearing loss, ringing in ears, abdominal pain, restless leg, frequent waking, daytime sleepiness, walking difficulty, memory loss, headache and weakness  PMH:  Past Medical History:  Diagnosis Date  . Barrett esophagus   . Chronic abdominal pain   . Esophageal cancer ([place])   . GERD (gastroesophageal reflux disease)   . H. pylori infection   . Hiatal hernia   . Hypertension   . Renal calculus   . Senile osteoporosis   . Stroke [facility])     PSH:  Past Surgical History:  Procedure Laterality Date  . BREATH TEK H PYLORI N/A 06/28/2014   Procedure: BREATH TEK H PYLORI;  Surgeon: Jerene Bears, MD;  Location: [facility];  Service: Gastroenterology;  Laterality: N/A;  . CHOLECYSTECTOMY    . COLONOSCOPY    . TONSILLECTOMY  Social History:  Social History   Socioeconomic History  . Marital status: Single    Spouse name: Not on file  . Number of children: Not on file  . Years of education: Not on file  . Highest education level: Not on file  Occupational History  . Not on file  Social Needs  . Financial resource strain: Not on file  . Food insecurity:    Worry: Not on file    Inability: Not on file  . Transportation needs:    Medical: Not on file    Non-medical: Not on file    Tobacco Use  . Smoking status: Never Smoker  . Smokeless tobacco: Never Used  Substance and Sexual Activity  . Alcohol use: No    Alcohol/week: 0.0 oz  . Drug use: No  . Sexual activity: Not on file  Lifestyle  . Physical activity:    Days per week: Not on file    Minutes per session: Not on file  . Stress: Not on file  Relationships  . Social connections:    Talks on phone: Not on file    Gets together: Not on file    Attends religious service: Not on file    Active member of club or organization: Not on file    Attends meetings of clubs or organizations: Not on file    Relationship status: Not on file  . Intimate partner violence:    Fear of current or ex partner: Not on file    Emotionally abused: Not on file    Physically abused: Not on file    Forced sexual activity: Not on file  Other Topics Concern  . Not on file  Social History Narrative  . Not on file    Family History:  Family History  Problem Relation Age of Onset  . Diabetes Father   . Heart disease Father   . Prostate cancer Father   . Diabetes Mother   . Kidney failure Mother   . Gout Mother   . Kidney disease Mother   . Diabetes Sister        twin  . Stroke Maternal Grandfather     Medications:   Current Outpatient Medications on File Prior to Visit  Medication Sig Dispense Refill  . amitriptyline (ELAVIL) 50 MG tablet TAKE ONE TABLET BY MOUTH ONCE DAILY AT BEDTIME (DISCONTINUE  AMITRIPTYLINE  25MG ) 30 tablet 1  . aspirin 325 MG tablet Take 1 tablet (325 mg total) by mouth daily. 30 tablet 0  . clobetasol ointment (TEMOVATE) 0.05 %     . Linaclotide (LINZESS) 290 MCG CAPS capsule Take 1 capsule (290 mcg total) by mouth daily. 30 capsule 1  . lisinopril (PRINIVIL,ZESTRIL) 10 MG tablet Take 10 mg by mouth.    . oxyCODONE (ROXICODONE) 15 MG immediate release tablet Take 1 tablet by mouth every 5 (five) hours as needed.    . ranitidine (ZANTAC) 300 MG tablet Take 300 mg by mouth at bedtime.    Marland Kitchen  rOPINIRole (REQUIP) 0.5 MG tablet     . rosuvastatin (CRESTOR) 20 MG tablet Take 1 tablet (20 mg total) by mouth daily at 6 PM. 30 tablet 0  . tamsulosin (FLOMAX) 0.4 MG CAPS capsule Take 0.4 mg by mouth daily.    . Vitamin D, Ergocalciferol, 2000 units CAPS Take 1 capsule by mouth daily.     No current facility-administered medications on file prior to visit.     Allergies:  No Known Allergies  Physical Exam  Vitals:   06/03/17 1257  Pulse: 65  Weight: 177 lb (80.3 kg)  Height: 5\' 5"  (1.651 m)   Body mass index is 29.45 kg/m. No exam data present  General: well developed, elderly Caucasian male, well nourished, seated, in no evident distress Head: head normocephalic and atraumatic.   Neck: supple with no carotid or supraclavicular bruits Cardiovascular: regular rate and rhythm, no murmurs Musculoskeletal: no deformity Skin:  no rash/petichiae Vascular:  Normal pulses all extremities  Neurologic Exam Mental Status: Awake and fully alert. Oriented to place and time. Recent and remote memory intact. Attention span, concentration and fund of knowledge appropriate. Mood and affect appropriate.  Cranial Nerves: Fundoscopic exam reveals sharp disc margins. Pupils equal, briskly reactive to light. Extraocular movements full without nystagmus. Visual fields full to confrontation. Hearing intact. Facial sensation intact. Face, tongue, palate moves normally and symmetrically.  Motor: Normal bulk and tone. Normal strength in all tested extremity muscles. Mild right dorsiflexion weakness. Sensory.: intact to touch , pinprick , position and vibratory sensation except for left toes which have decreased pinprick and vibratory sensation Coordination: Right hand decreased dexterity. Finger-to-nose and heel-to-shin performed accurately bilaterally.  Gait and Station: Arises from chair without difficulty. Stance is normal. Gait demonstrates normal stride with stiffened right leg. Able to heel, toe  and tandem walk without difficulty.  Reflexes: 1+ and symmetric. Toes downgoing.    NIHSS  0  Modified Rankin  1    Diagnostic Data (Labs, Imaging, Testing)  Ct Head Wo Contrast Result Date: 05/02/2017 IMPRESSION: Normal head CT.   Mr Brain Wo Contrast  Result Date: 05/02/2017 IMPRESSION: 1. Acute lacunar infarct in the left paracentral pons. No associated hemorrhage or mass effect. 2. Otherwise normal for age noncontrast MRI appearance of the brain.  US Venous Img Lower Right (dvt Study) Result Date: 05/02/2017 IMPRESSION: No evidence of deep venous thrombosis in right lower extremity  Mr Jodene Nam Head (cerebral Arteries) Result Date: 05/02/2017 IMPRESSION: 1. Negative posterior circulation. No Basilar Artery irregularity or stenosis. 2. 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 Negative anterior circulation otherwise.   Echocardiogram:    Result date: 05/04/17 - Left ventricle: The cavity size was normal. Wall thickness was   normal. Systolic function was normal. The estimated ejection   fraction was in the range of 60% to 65%. Wall motion was normal;   there were no regional wall motion abnormalities. Doppler   parameters are consistent with abnormal left ventricular   relaxation (grade 1 diastolic dysfunction). - Aortic valve: There was mild regurgitation. - Mitral valve: There was mild regurgitation                                            CTA Head/Neck  Result date: 05/03/17                                   IMPRESSION: Patient with recent pontine stroke. Evaluation of the posterior circulation shows a 30% stenosis at the left vertebral artery origin but wide patency beyond that through the neck. The right vertebral artery is a small vessel that terminates in PICA. There is atherosclerotic disease of the V4 segment of the left vertebral artery which could relate to the brainstem infarction. Stenosis is estimated at 50% in this segment. The basilar artery  shows mild atherosclerotic narrowing without a focal stenosis.  Mild soft plaque at the right carotid bifurcation but without stenosis. Left carotid bifurcation is normal. Atherosclerotic change in both carotid siphon regions. On the right, there is a 50% stenosis of the supraclinoid ICA. On the left, there are 30% stenoses of the ICA at the proximal siphon in the supraclinoid level, and there is mild atherosclerotic ectasia in the siphon itself    ASSESSMENT: Douglas Odonnell is a 72 y.o. year old male here with left lacunar infarct on 05/02/17 secondary to small vessel disease. Vascular risk factors include HTN and HLD.    PLAN: -Continue aspirin 325 mg daily  and Crestor 20mg   for secondary stroke prevention -F/u with PCP regarding your HTN and HLD management -continue to do home exercises at home  -continue to monitor BP at home  -Maintain strict control of hypertension with blood pressure goal below 130/90, diabetes with hemoglobin A1c goal below 6.5% and cholesterol with LDL cholesterol (bad cholesterol) goal below 70 mg/dL. I also advised the patient to eat a healthy diet with plenty of whole grains, cereals, fruits and vegetables, exercise regularly and maintain ideal body weight.  Follow up in 6 months or call earlier if needed  Greater than 50% time during this 25 minute consultation visit was spent on counseling and coordination of care about HLD and HTN, discussion about risk benefit of anticoagulation and answering questions.     Venancio Poisson, AGNP-BC  [facility] [address] [place], [place] [address]  Phone [phone] Fax [phone]

## 2017-06-03 NOTE — Progress Notes (Signed)
I agree with the assessment and plan as directed by NP .The patient is not  known to me, but I was available for consultation.  Cc Drs Caesar Chestnut.   Jaziyah Gradel, MD

## 2017-06-03 NOTE — Patient Instructions (Signed)
Continue aspirin 325 mg daily  and Crestor  for secondary stroke prevention  Follow up with your PCP regarding cholesterol and blood pressure management  Monitor left foot for numbness/tingling or pain  Monitor blood pressure at home and record. Bring this with you to see your PCP   Maintain strict control of hypertension with blood pressure goal below 130/90, diabetes with hemoglobin A1c goal below 6.5% and cholesterol with LDL cholesterol (bad cholesterol) goal below 70 mg/dL. I also advised the patient to eat a healthy diet with plenty of whole grains, cereals, fruits and vegetables, exercise regularly and maintain ideal body weight.  Followup in the future with me in 6 months

## 2017-06-04 NOTE — Progress Notes (Signed)
I agree with the above plan 

## 2017-12-09 ENCOUNTER — Ambulatory Visit (INDEPENDENT_AMBULATORY_CARE_PROVIDER_SITE_OTHER): Payer: Medicare Other | Admitting: Adult Health

## 2017-12-09 ENCOUNTER — Encounter: Payer: Self-pay | Admitting: Adult Health

## 2017-12-09 VITALS — BP 144/91 | HR 94 | Ht 65.0 in | Wt 182.0 lb

## 2017-12-09 DIAGNOSIS — E785 Hyperlipidemia, unspecified: Secondary | ICD-10-CM | POA: Diagnosis not present

## 2017-12-09 DIAGNOSIS — I6302 Cerebral infarction due to thrombosis of basilar artery: Secondary | ICD-10-CM

## 2017-12-09 DIAGNOSIS — R51 Headache: Secondary | ICD-10-CM | POA: Diagnosis not present

## 2017-12-09 DIAGNOSIS — I10 Essential (primary) hypertension: Secondary | ICD-10-CM | POA: Diagnosis not present

## 2017-12-09 DIAGNOSIS — R519 Headache, unspecified: Secondary | ICD-10-CM

## 2017-12-09 MED ORDER — ROSUVASTATIN CALCIUM 20 MG PO TABS: 20.0000 mg | ORAL_TABLET | Freq: Every day | ORAL | 3 refills | Status: AC

## 2017-12-09 MED ORDER — TOPIRAMATE 25 MG PO TABS: 25.0000 mg | ORAL_TABLET | Freq: Two times a day (BID) | ORAL | 3 refills | Status: DC

## 2017-12-09 NOTE — Progress Notes (Signed)
I agree with the above plan 

## 2017-12-09 NOTE — Patient Instructions (Signed)
Continue aspirin 325 mg daily  and restart Crestor  for secondary stroke prevention  Continue to follow up with PCP regarding cholesterol and blood pressure management   Start topamax 25mg  twice a day for headaches. If after 2 weeks you continue to have headaches, please call office and we can increase dose  Continue to stay active and maintain a healthy diet  Continue to monitor blood pressure at home  Maintain strict control of hypertension with blood pressure goal below 130/90, diabetes with hemoglobin A1c goal below 6.5% and cholesterol with LDL cholesterol (bad cholesterol) goal below 70 mg/dL. I also advised the patient to eat a healthy diet with plenty of whole grains, cereals, fruits and vegetables, exercise regularly and maintain ideal body weight.  Followup in the future with me in 6 months or call earlier if needed       Thank you for coming to see [place] at [facility]. I hope we have been able to provide you high quality care today.  You may receive a patient satisfaction survey over the next few weeks. We would appreciate your feedback and comments so that we may continue to improve ourselves and the health of our patients.

## 2017-12-09 NOTE — Progress Notes (Signed)
[facility] [address] [place]. [place] [address] [phone]       OFFICE FOLLOW UP NOTE  Mr. Douglas Odonnell Date of Birth:  [birth year] Medical Record Number:  [id number]   Reason for visit: stroke follow up  CHIEF COMPLAINT:  Chief Complaint  Patient presents with  . Follow-up    CVA follow up room 9 patient with wife and daughter , Enid Derry his sister, Opal Sidles  his sister    HPI: Alger Kerstein is being seen today in the office for left lacunar infarct on 05/02/17. History obtained from patient  and chart review. Reviewed all radiology images and labs personally.  Callan Norden a 72 y.o.malewith a history of hypertension who presented with right-sided weakness on 05/02/17. He stated that it started likely on Tuesday (04/29/17), but is not sure. He noticed it being persistent, though it had slightly improved.Due to the persistent symptoms, he sought care in the [facility] emergency department. 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 CT scan reviewed which was normal. MRI head reviewed which showed acute lacunar infarct in the left paracentral pons without hemorrhage or mass effect. MRA head negative for stenosis. Patient transferred to [facility] for further neurology/stroke work up.  CTA head and neck obtained once at [facility] and showed 30% stenosis of left vertebral artery, 50% stenosis of the right supraclinoid ICA, and 30% stenosis of the left ICA. 2D echo showed an EF of 60-65%. LDL 109 and A1c 6.0. No antithrombotic medication PTA and recommended starting aspirin 325mg  daily. Also recommended Crestor 20mg . Patient discharged home in stable condition.   06/03/2017 visit: Since discharge, patient has been doing well and is accompanied today by his 2 sisters.  Continues to take aspirin without side effects of increased bleeding or bruising.  Continues to take Crestor without side effects of myalgias.  Blood pressure elevated at 170/98 and rechecked towards the end of visit  for 150/92.  Patient does not check blood pressure at home but states he is able to start.  Patient refusing any type of therapy but does state that he does his own therapies at home such as exercising and using a stress ball for fine finger movements.  He states he has mild right-sided weakness and it has greatly improved.  Does state that he has excessive daytime sleepiness and naps easily.  Explained OSA and how this can have effect on stroke, blood pressure and cholesterol along with increased risk for heart disease but patient refusing to have sleep study done at this time.  Declines participation in [place]  trial.  Denies new or worsening stroke/TIA symptoms.    Interval history 12/09/2017: Patient is being seen today for follow-up appointment and is accompanied by his 2 sisters.  He does have complaints of daily headaches that have been present since his stroke but does state that he forgot to mention this during last visit.  They are located on the top of his head, both sides and forehead with a pressure-like sensation and at times sharp stabbing pain.  He states when it does occur it will last only a few minutes and then subside.  This does happen daily and can be either 1 time per day or multiple times throughout the day.  He is unable to correlate any contributing factors that can worsen this.  He denies personal history of headaches or migraines but does report family history of migraines.  He denies excessive daytime fatigue which she did complain of at prior visit and  states he occasionally will take a nap during the day.  He continues to take aspirin 325 mg daily without side effects of bleeding or bruising.  He states his PCP recently stopped his Crestor as patient complains of generalized body pains but most specifically in his knees and legs.  He stopped Crestor approximately 1 week ago but reports continuation of pains.  Blood pressure today 144/91 and this is elevated per patient as he does  monitor at home and typically SBP 1 20-1 30.  Denies new or worsening stroke/TIA symptoms.   ROS:   14 system review of systems performed and negative with exception of hearing loss, ringing in ears, eye pain, abdominal pain, restless leg, joint pain and headache  PMH:  Past Medical History:  Diagnosis Date  . Barrett esophagus   . Chronic abdominal pain   . Esophageal cancer ([place])   . GERD (gastroesophageal reflux disease)   . H. pylori infection   . Hiatal hernia   . Hypertension   . Renal calculus   . Senile osteoporosis   . Stroke [facility])     PSH:  Past Surgical History:  Procedure Laterality Date  . BREATH TEK H PYLORI N/A 06/28/2014   Procedure: BREATH TEK H PYLORI;  Surgeon: Jerene Bears, MD;  Location: [facility];  Service: Gastroenterology;  Laterality: N/A;  . CHOLECYSTECTOMY    . COLONOSCOPY    . TONSILLECTOMY      Social History:  Social History   Socioeconomic History  . Marital status: Single    Spouse name: Not on file  . Number of children: Not on file  . Years of education: Not on file  . Highest education level: Not on file  Occupational History  . Not on file  Social Needs  . Financial resource strain: Not on file  . Food insecurity:    Worry: Not on file    Inability: Not on file  . Transportation needs:    Medical: Not on file    Non-medical: Not on file  Tobacco Use  . Smoking status: Never Smoker  . Smokeless tobacco: Never Used  Substance and Sexual Activity  . Alcohol use: No    Alcohol/week: 0.0 standard drinks  . Drug use: No  . Sexual activity: Not on file  Lifestyle  . Physical activity:    Days per week: Not on file    Minutes per session: Not on file  . Stress: Not on file  Relationships  . Social connections:    Talks on phone: Not on file    Gets together: Not on file    Attends religious service: Not on file    Active member of club or organization: Not on file    Attends meetings of clubs or organizations: Not on  file    Relationship status: Not on file  . Intimate partner violence:    Fear of current or ex partner: Not on file    Emotionally abused: Not on file    Physically abused: Not on file    Forced sexual activity: Not on file  Other Topics Concern  . Not on file  Social History Narrative  . Not on file    Family History:  Family History  Problem Relation Age of Onset  . Diabetes Father   . Heart disease Father   . Prostate cancer Father   . Diabetes Mother   . Kidney failure Mother   . Gout Mother   . Kidney disease  Mother   . Diabetes Sister        twin  . Stroke Maternal Grandfather     Medications:   Current Outpatient Medications on File Prior to Visit  Medication Sig Dispense Refill  . amitriptyline (ELAVIL) 50 MG tablet TAKE ONE TABLET BY MOUTH ONCE DAILY AT BEDTIME (DISCONTINUE  AMITRIPTYLINE  25MG ) 30 tablet 1  . aspirin 325 MG tablet Take 1 tablet (325 mg total) by mouth daily. 30 tablet 0  . clobetasol ointment (TEMOVATE) 0.05 %     . esomeprazole (NEXIUM) 40 MG capsule     . Linaclotide (LINZESS) 290 MCG CAPS capsule Take 1 capsule (290 mcg total) by mouth daily. 30 capsule 1  . lisinopril (PRINIVIL,ZESTRIL) 10 MG tablet Take 30 mg by mouth.     . oxyCODONE (ROXICODONE) 15 MG immediate release tablet Take 1 tablet by mouth every 5 (five) hours as needed.    . ranitidine (ZANTAC) 300 MG tablet Take 300 mg by mouth at bedtime.    Marland Kitchen rOPINIRole (REQUIP) 0.5 MG tablet     . tamsulosin (FLOMAX) 0.4 MG CAPS capsule Take 0.4 mg by mouth daily.    . Vitamin D, Ergocalciferol, 2000 units CAPS Take 1 capsule by mouth daily.     No current facility-administered medications on file prior to visit.     Allergies:  No Known Allergies   Physical Exam  Vitals:   12/09/17 1315  BP: (!) 144/91  Pulse: 94  Weight: 182 lb (82.6 kg)  Height: 5\' 5"  (1.651 m)   Body mass index is 30.29 kg/m. No exam data present  General: well developed, elderly Caucasian male, well  nourished, seated, in no evident distress Head: head normocephalic and atraumatic.   Neck: supple with no carotid or supraclavicular bruits Cardiovascular: regular rate and rhythm, no murmurs Musculoskeletal: no deformity Skin:  no rash/petichiae Vascular:  Normal pulses all extremities  Neurologic Exam Mental Status: Awake and fully alert. Oriented to place and time. Recent and remote memory intact. Attention span, concentration and fund of knowledge appropriate. Mood and affect appropriate.  Cranial Nerves: Fundoscopic exam deferred. Pupils equal, briskly reactive to light. Extraocular movements full without nystagmus. Visual fields full to confrontation. Hearing intact. Facial sensation intact. Face, tongue, palate moves normally and symmetrically.  Motor: Normal bulk and tone. Normal strength in all tested extremity muscles. Mild right dorsiflexion weakness. Sensory.: intact to touch , pinprick , position and vibratory sensation except for left toes which have decreased pinprick and vibratory sensation Coordination: Right hand decreased dexterity. Finger-to-nose and heel-to-shin performed accurately bilaterally.  Gait and Station: Arises from chair without difficulty. Stance is normal. Gait demonstrates normal stride with stiffened right leg. Able to heel, toe and tandem walk without difficulty.  Reflexes: 1+ and symmetric. Toes downgoing.      Diagnostic Data (Labs, Imaging, Testing)  Ct Head Wo Contrast Result Date: 05/02/2017 IMPRESSION: Normal head CT.   Mr Brain Wo Contrast  Result Date: 05/02/2017 IMPRESSION: 1. Acute lacunar infarct in the left paracentral pons. No associated hemorrhage or mass effect. 2. Otherwise normal for age noncontrast MRI appearance of the brain.  US Venous Img Lower Right (dvt Study) Result Date: 05/02/2017 IMPRESSION: No evidence of deep venous thrombosis in right lower extremity  Mr Jodene Nam Head (cerebral Arteries) Result Date:  05/02/2017 IMPRESSION: 1. Negative posterior circulation. No Basilar Artery irregularity or stenosis. 2. 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 Negative anterior circulation otherwise.   Echocardiogram:    Result date: 05/04/17 -  Left ventricle: The cavity size was normal. Wall thickness was   normal. Systolic function was normal. The estimated ejection   fraction was in the range of 60% to 65%. Wall motion was normal;   there were no regional wall motion abnormalities. Doppler   parameters are consistent with abnormal left ventricular   relaxation (grade 1 diastolic dysfunction). - Aortic valve: There was mild regurgitation. - Mitral valve: There was mild regurgitation                                            CTA Head/Neck  Result date: 05/03/17                                   IMPRESSION: Patient with recent pontine stroke. Evaluation of the posterior circulation shows a 30% stenosis at the left vertebral artery origin but wide patency beyond that through the neck. The right vertebral artery is a small vessel that terminates in PICA. There is atherosclerotic disease of the V4 segment of the left vertebral artery which could relate to the brainstem infarction. Stenosis is estimated at 50% in this segment. The basilar artery shows mild atherosclerotic narrowing without a focal stenosis.  Mild soft plaque at the right carotid bifurcation but without stenosis. Left carotid bifurcation is normal. Atherosclerotic change in both carotid siphon regions. On the right, there is a 50% stenosis of the supraclinoid ICA. On the left, there are 30% stenoses of the ICA at the proximal siphon in the supraclinoid level, and there is mild atherosclerotic ectasia in the siphon itself    ASSESSMENT: Waddell Iten is a 72 y.o. year old male here with left lacunar infarct on 05/02/17 secondary to small vessel disease. Vascular risk factors include HTN and HLD.  Patient is being seen  today for follow-up appointment and overall has been stable from a stroke standpoint.  Does have complaints of daily headaches that lasts a few minutes at has been present since his stroke.   PLAN: -Continue aspirin 325 mg daily  for secondary stroke prevention -Recommended to restart Crestor 20 mg daily for secondary stroke prevention and known vessel stenosis.  As generalized aches/pains have not subsided there is a low chance of these pains are related to statin use.  Patient also stated that he has had these pains for many years even prior to starting Crestor. -Start Topamax 25 mg twice daily for headache management and advised to call after 2 weeks if headaches continue we can consider increase at that time.  If tolerating well with control of headaches, advised to call after 1 month for refill of prescription -F/u with PCP regarding your HTN and HLD management -request lipid panel check at follow-up appointment with PCP to ensure adequate dosage of Crestor for HLD management -continue to do home exercises at home along with maintaining a healthy diet -continue to monitor BP at home -Maintain strict control of hypertension with blood pressure goal below 130/90, diabetes with hemoglobin A1c goal below 6.5% and cholesterol with LDL cholesterol (bad cholesterol) goal below 70 mg/dL. I also advised the patient to eat a healthy diet with plenty of whole grains, cereals, fruits and vegetables, exercise regularly and maintain ideal body weight.  Follow up in 6 months or call earlier if needed  Greater than 50% time during  this 25 minute consultation visit was spent on counseling and coordination of care about HLD and HTN, discussion about risk benefit of anticoagulation and answering questions.    Venancio Poisson, AGNP-BC  [facility] [address] [place], [place] [address]  Phone [phone] Fax [phone]

## 2018-04-20 ENCOUNTER — Encounter: Payer: Self-pay | Admitting: Internal Medicine

## 2018-06-10 ENCOUNTER — Ambulatory Visit: Payer: Medicare Other | Admitting: Adult Health

## 2018-08-02 ENCOUNTER — Other Ambulatory Visit: Payer: Self-pay | Admitting: Adult Health

## 2018-08-04 ENCOUNTER — Other Ambulatory Visit: Payer: Self-pay | Admitting: Adult Health

## 2018-08-04 ENCOUNTER — Other Ambulatory Visit: Payer: Self-pay

## 2018-08-04 MED ORDER — TOPIRAMATE 25 MG PO TABS: 25.0000 mg | ORAL_TABLET | Freq: Two times a day (BID) | ORAL | 0 refills | Status: AC

## 2019-01-20 ENCOUNTER — Other Ambulatory Visit: Payer: Self-pay | Admitting: Adult Health

## 2019-01-24 ENCOUNTER — Other Ambulatory Visit: Payer: Self-pay | Admitting: Adult Health

## 2019-12-09 IMAGING — DX DG CHEST 2V
2 series · 2 of 2 positions shown · non-contrast
Comparison: Chest CT 11/02/2014 and earlier.

CLINICAL DATA: 71-year-old male with right side weakness for
several days.

EXAM:
CHEST  2 VIEW

[chest pa]
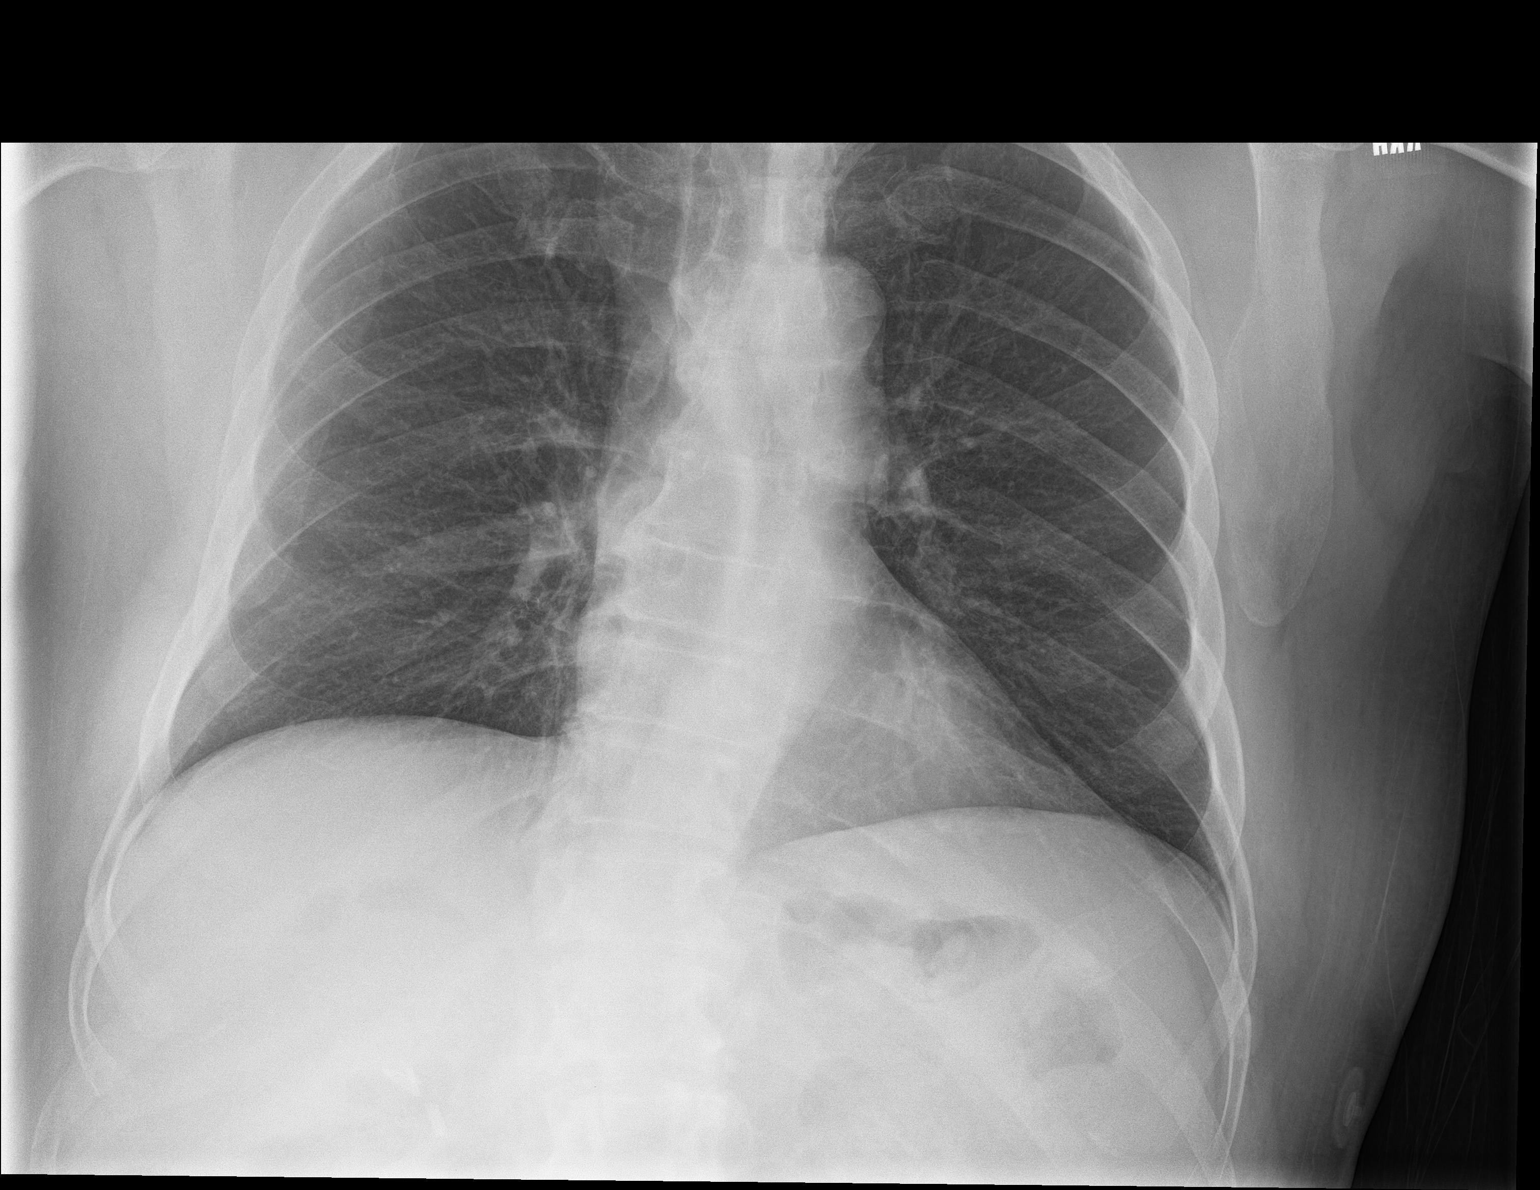

[chest lat]
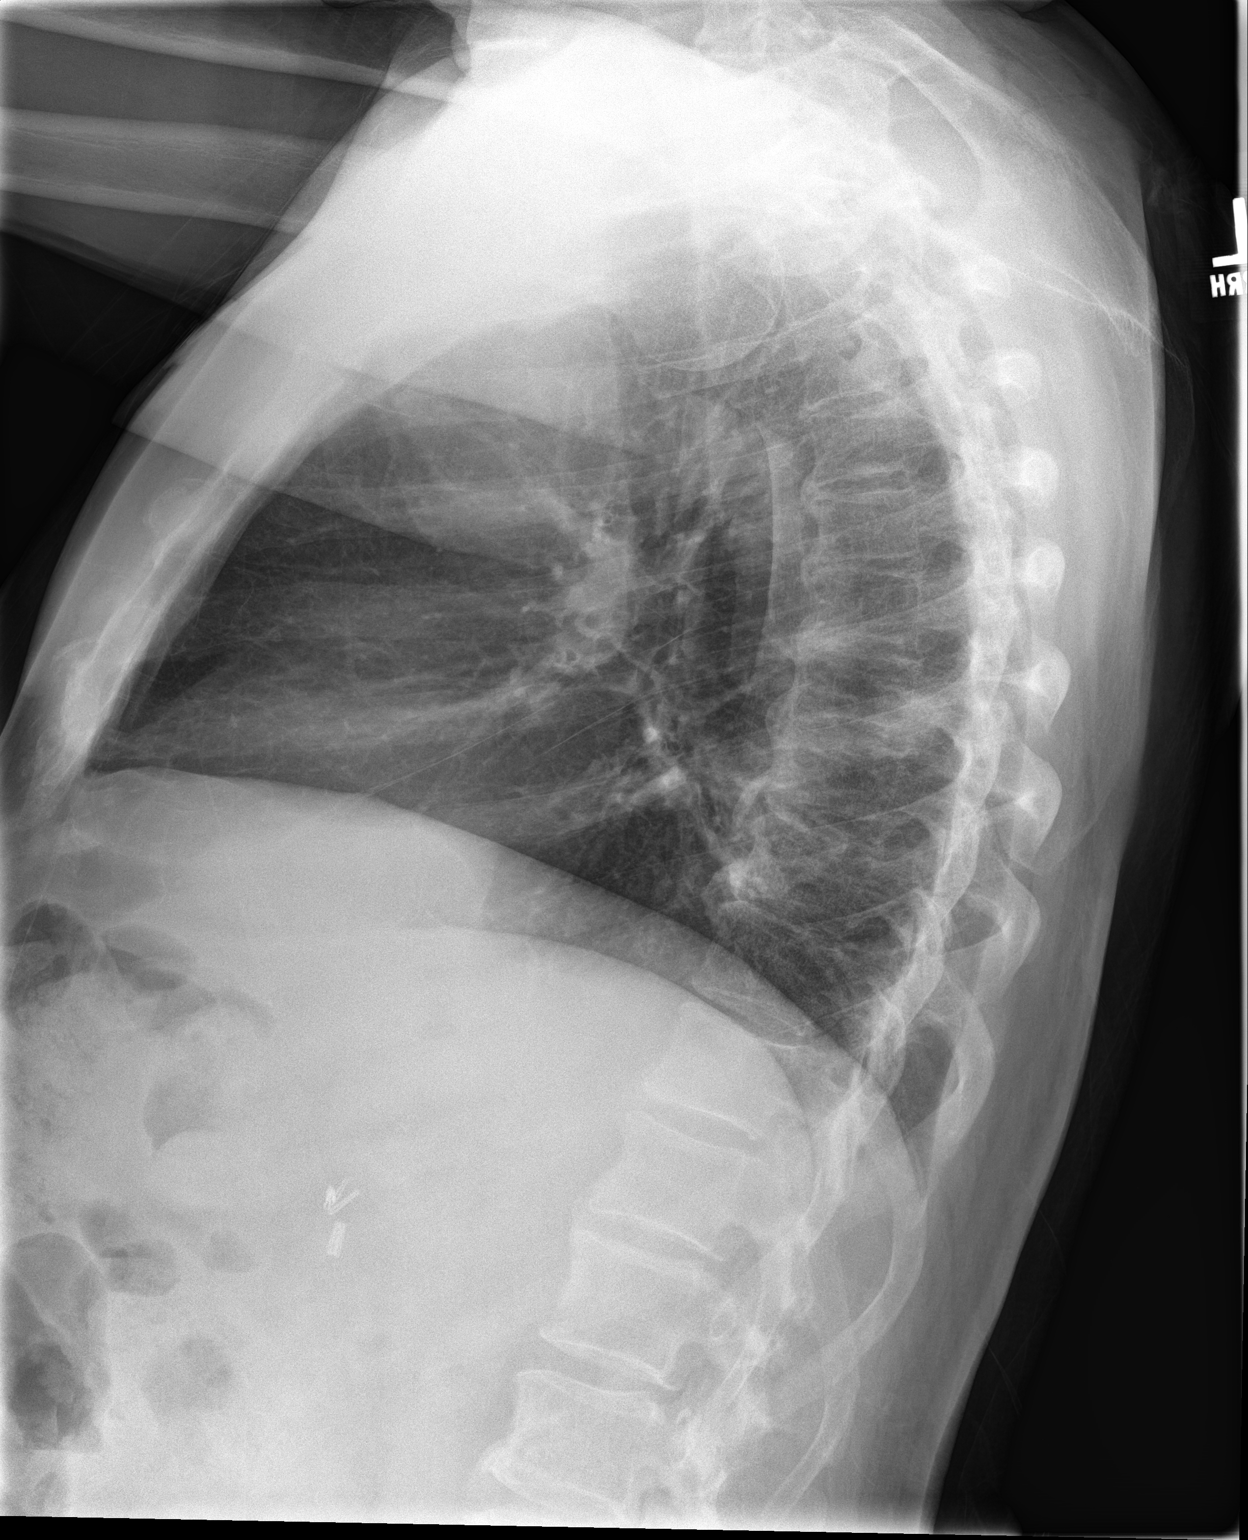

[2 of 2 positions shown; findings below may reference images not displayed]

FINDINGS: Lung volumes are within normal limits. Mediastinal contours remain
normal. Visualized tracheal air column is within normal limits. No
pneumothorax, pulmonary edema, pleural effusion or confluent
pulmonary opacity. Mild chronic scoliosis. No acute osseous
abnormality identified. Stable cholecystectomy clips. Negative
visible bowel gas pattern.
IMPRESSION: Negative.  No acute cardiopulmonary abnormality.

## 2019-12-09 IMAGING — MR MR MRA HEAD W/O CM
1 series · 13 of 48 positions shown · non-contrast
Comparison: Brain MRI today reported separately.

CLINICAL DATA: 71-year-old male with acute pontine lacunar infarct
on brain MRI today. Right side weakness, headache and facial droop
since yesterday. Slurred speech.

Personal history of esophageal cancer.
EXAM:
MRA HEAD WITHOUT CONTRAST
TECHNIQUE: Angiographic images of the Circle of Willis were obtained using MRA
technique without intravenous contrast.

[Series 1: MRA · axial · 0.6mm · 0.30mm/px · z∈[-81,+32]mm · 13 of 200 slices shown]
[im 1/200]
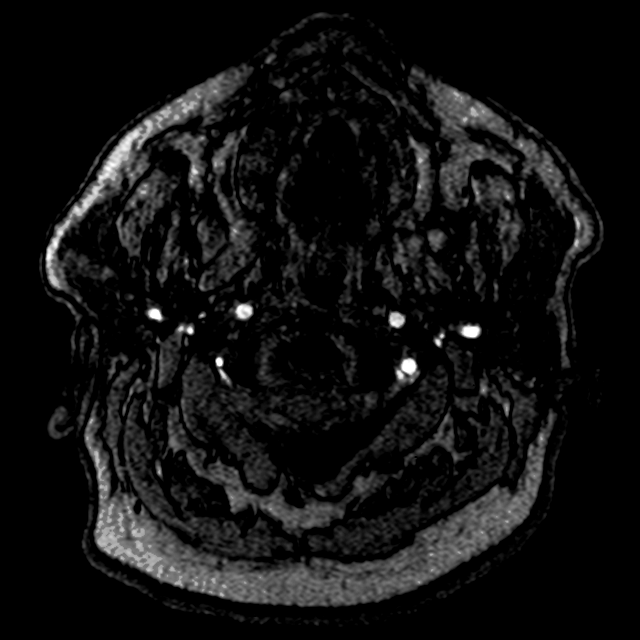
[im 5/200]
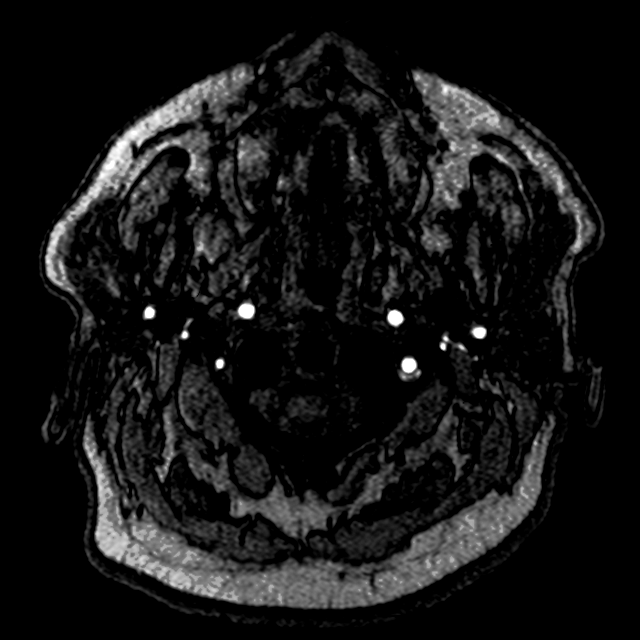
[im 13/200]
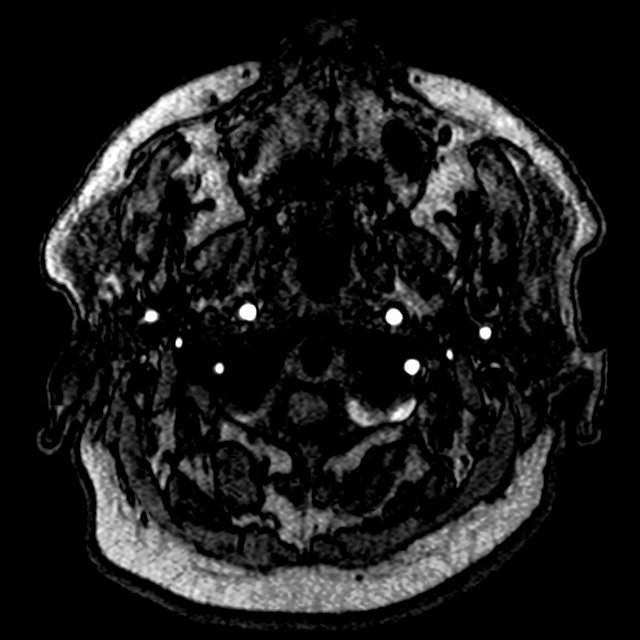
[im 34/200]
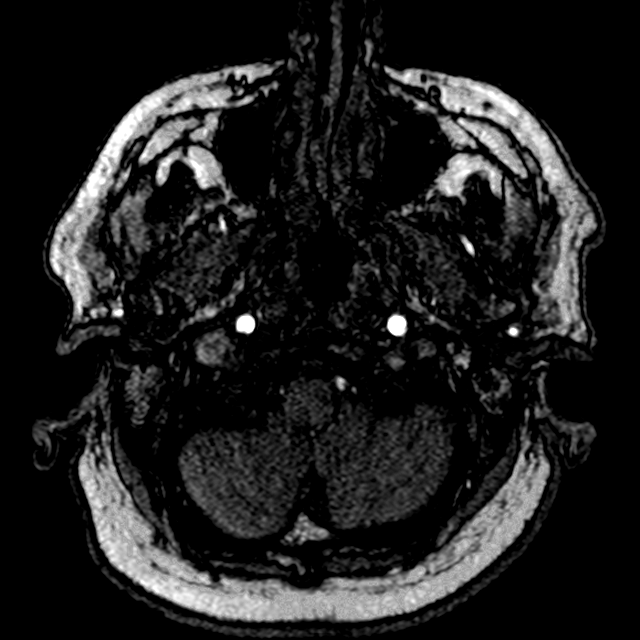
[im 39/200]
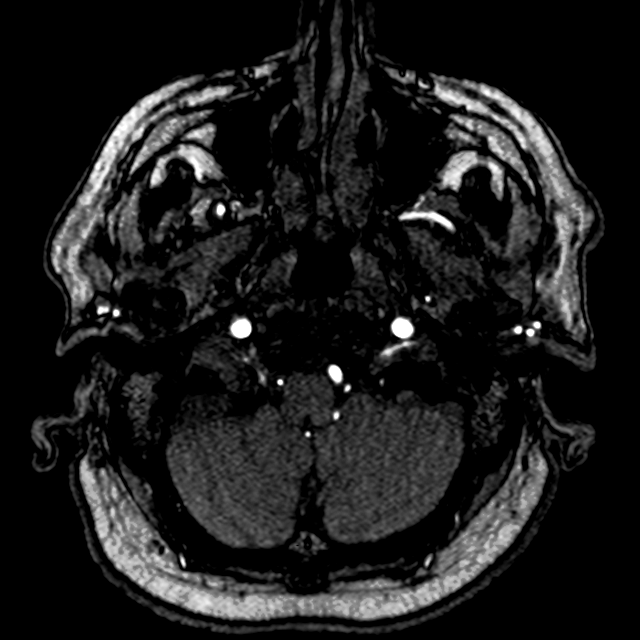
[im 64/200]
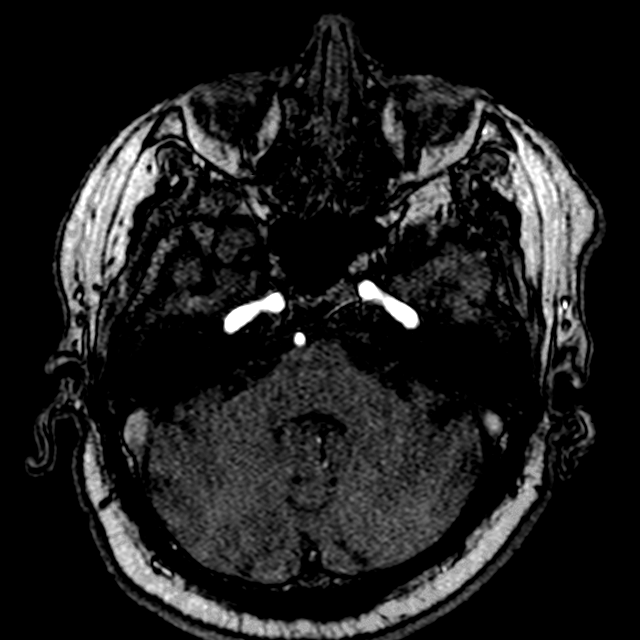
[im 89/200]
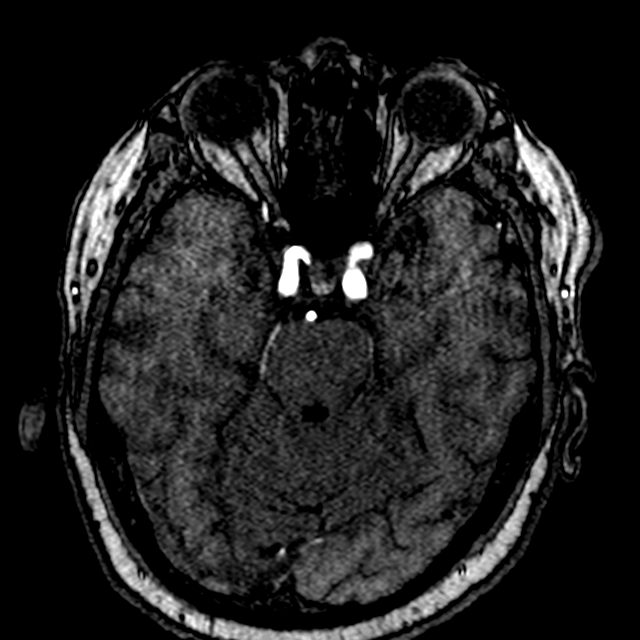
[im 102/200]
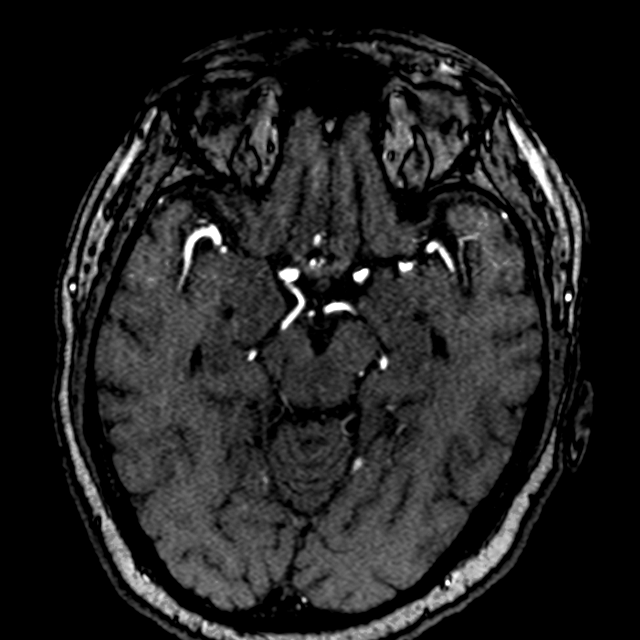
[im 115/200]
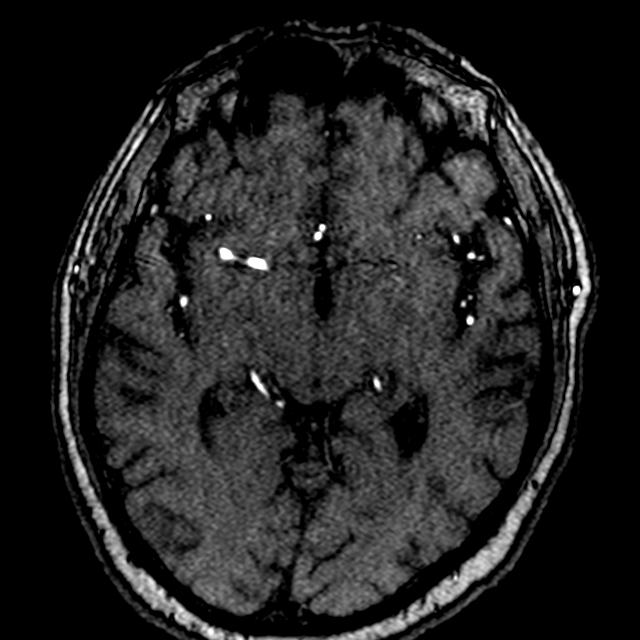
[im 140/200]
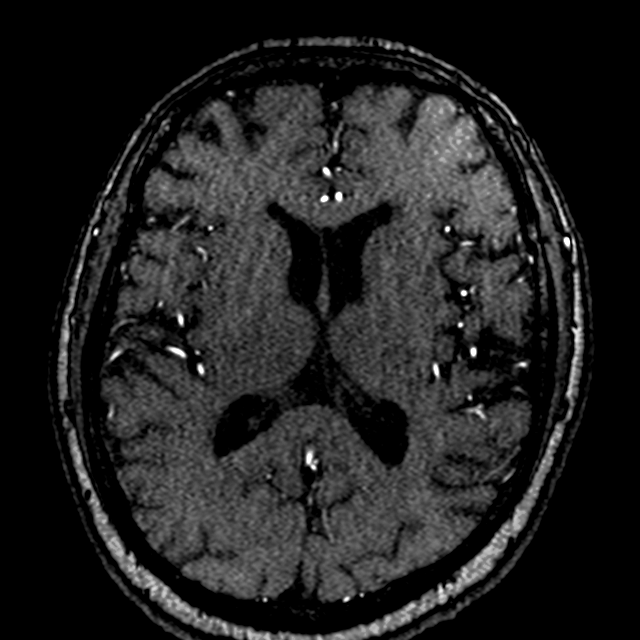
[im 166/200]
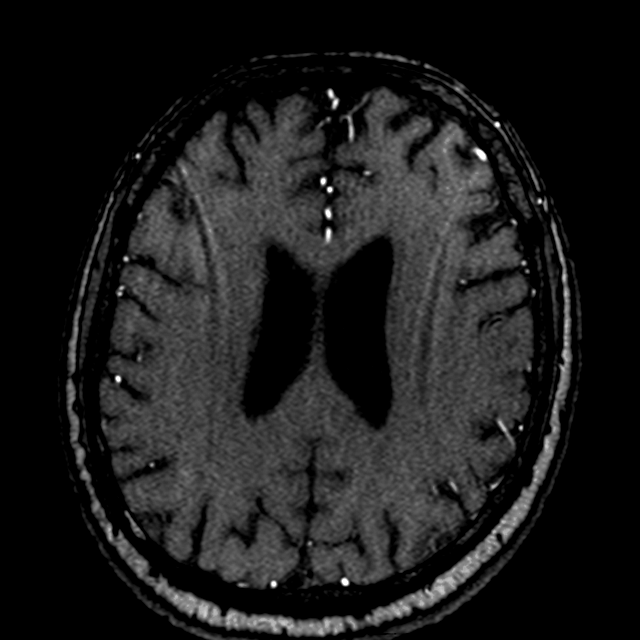
[im 170/200]
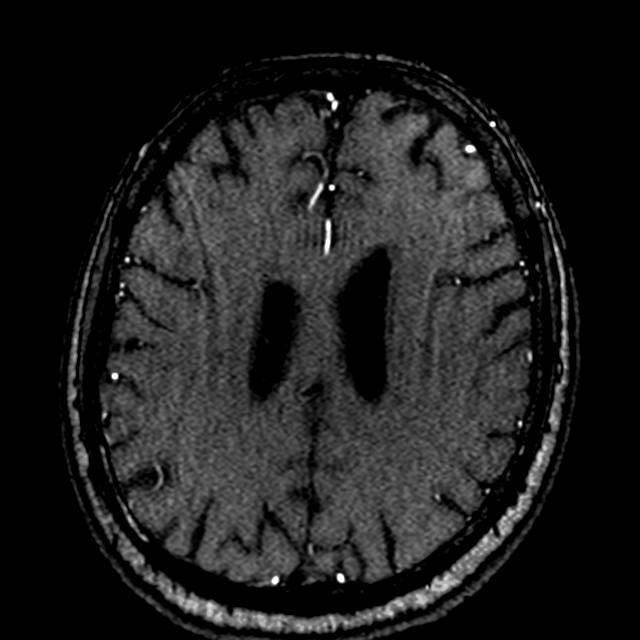
[im 191/200]
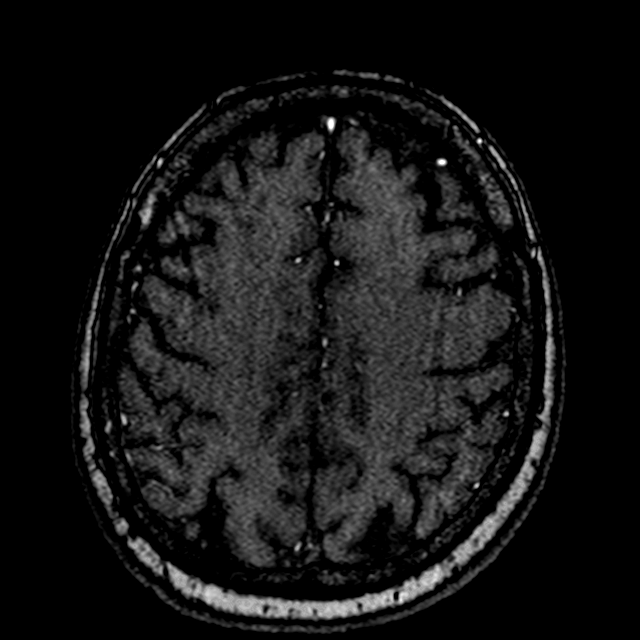

[13 of 48 positions shown; findings below may reference images not displayed]

FINDINGS: Antegrade flow in the posterior circulation with dominant distal
left vertebral artery. The non dominant distal right vertebral
artery terminates in the right PICA. Patent left PICA origin. No
distal left vertebral artery stenosis.

The left vertebral supplies the basilar. There is no basilar artery
irregularity or stenosis. The SCA and left PCA origins are normal.
There is a fetal type right PCA origin. Bilateral PCA branches are
within normal limits.

Antegrade flow in both ICA siphons. Mild bilateral siphon
irregularity. No significant ICA siphon stenosis. Normal ophthalmic
and right posterior communicating artery origins. The left posterior
communicating artery is diminutive or absent.

Patent carotid termini. Normal MCA and ACA origins. Diminutive
anterior communicating artery. Bilateral ACA branches are within
normal limits. Left MCA M1 segment, MCA bifurcation and visible left
MCA branches are within normal limits. Right MCA M1 segment,
bifurcation, and visible right MCA branches are within normal
limits.
IMPRESSION: 1. Negative posterior circulation. No Basilar Artery irregularity or
stenosis.
2. Bilateral ICA siphon atherosclerosis without stenosis. Negative
anterior circulation otherwise.

## 2023-03-13 ENCOUNTER — Emergency Department (HOSPITAL_COMMUNITY): Payer: Medicare Other | Admitting: Anesthesiology

## 2023-03-13 ENCOUNTER — Ambulatory Visit (HOSPITAL_COMMUNITY)
Admission: EM | Admit: 2023-03-13 | Discharge: 2023-03-13 | Disposition: A | Discharge status: Home / Self Care | Payer: Medicare Other | Attending: Emergency Medicine | Admitting: Emergency Medicine

## 2023-03-13 ENCOUNTER — Other Ambulatory Visit: Payer: Self-pay

## 2023-03-13 ENCOUNTER — Emergency Department (EMERGENCY_DEPARTMENT_HOSPITAL): Payer: Medicare Other | Admitting: Anesthesiology

## 2023-03-13 ENCOUNTER — Encounter (HOSPITAL_COMMUNITY): Payer: Self-pay | Admitting: Emergency Medicine

## 2023-03-13 ENCOUNTER — Emergency Department (HOSPITAL_COMMUNITY): Payer: Medicare Other

## 2023-03-13 ENCOUNTER — Encounter (HOSPITAL_COMMUNITY)
Admission: EM | Disposition: A | Discharge status: Home / Self Care | Payer: Self-pay | Source: Home / Self Care | Attending: Emergency Medicine

## 2023-03-13 ENCOUNTER — Telehealth (INDEPENDENT_AMBULATORY_CARE_PROVIDER_SITE_OTHER): Payer: Self-pay | Admitting: *Deleted

## 2023-03-13 DIAGNOSIS — Z8501 Personal history of malignant neoplasm of esophagus: Secondary | ICD-10-CM | POA: Insufficient documentation

## 2023-03-13 DIAGNOSIS — I10 Essential (primary) hypertension: Secondary | ICD-10-CM

## 2023-03-13 DIAGNOSIS — Z8719 Personal history of other diseases of the digestive system: Secondary | ICD-10-CM | POA: Insufficient documentation

## 2023-03-13 DIAGNOSIS — T18128A Food in esophagus causing other injury, initial encounter: Secondary | ICD-10-CM

## 2023-03-13 DIAGNOSIS — K209 Esophagitis, unspecified without bleeding: Secondary | ICD-10-CM

## 2023-03-13 DIAGNOSIS — Z8673 Personal history of transient ischemic attack (TIA), and cerebral infarction without residual deficits: Secondary | ICD-10-CM | POA: Insufficient documentation

## 2023-03-13 DIAGNOSIS — K269 Duodenal ulcer, unspecified as acute or chronic, without hemorrhage or perforation: Secondary | ICD-10-CM | POA: Diagnosis not present

## 2023-03-13 DIAGNOSIS — K2100 Gastro-esophageal reflux disease with esophagitis, without bleeding: Secondary | ICD-10-CM | POA: Insufficient documentation

## 2023-03-13 DIAGNOSIS — R1310 Dysphagia, unspecified: Secondary | ICD-10-CM

## 2023-03-13 DIAGNOSIS — W44F3XA Food entering into or through a natural orifice, initial encounter: Secondary | ICD-10-CM

## 2023-03-13 DIAGNOSIS — Z7982 Long term (current) use of aspirin: Secondary | ICD-10-CM | POA: Diagnosis not present

## 2023-03-13 DIAGNOSIS — K3189 Other diseases of stomach and duodenum: Secondary | ICD-10-CM | POA: Insufficient documentation

## 2023-03-13 DIAGNOSIS — G709 Myoneural disorder, unspecified: Secondary | ICD-10-CM | POA: Diagnosis not present

## 2023-03-13 DIAGNOSIS — T189XXA Foreign body of alimentary tract, part unspecified, initial encounter: Secondary | ICD-10-CM | POA: Diagnosis present

## 2023-03-13 DIAGNOSIS — Z8249 Family history of ischemic heart disease and other diseases of the circulatory system: Secondary | ICD-10-CM | POA: Insufficient documentation

## 2023-03-13 HISTORY — PX: FOREIGN BODY REMOVAL: SHX962

## 2023-03-13 HISTORY — PX: ESOPHAGOGASTRODUODENOSCOPY (EGD) WITH PROPOFOL: SHX5813

## 2023-03-13 LAB — BASIC METABOLIC PANEL
Anion gap: 10 (ref 5–15)
BUN: 19 mg/dL (ref 8–23)
CO2: 26 mmol/L (ref 22–32)
Calcium: 9.2 mg/dL (ref 8.9–10.3)
Chloride: 102 mmol/L (ref 98–111)
Creatinine, Ser: 1.05 mg/dL (ref 0.61–1.24)
GFR, Estimated: >60 mL/min (ref >60)
Glucose, Bld: 106 mg/dL — ABNORMAL HIGH (ref 70–99)
Potassium: 3.8 mmol/L (ref 3.5–5.1)
Sodium: 138 mmol/L (ref 135–145)

## 2023-03-13 LAB — CBC WITH DIFFERENTIAL/PLATELET
Abs Immature Granulocytes: 0.02 10*3/uL (ref 0.00–0.07)
Basophils Absolute: 0 10*3/uL (ref 0.0–0.1)
Basophils Relative: 0 %
Eosinophils Absolute: 0 10*3/uL (ref 0.0–0.5)
Eosinophils Relative: 1 %
HCT: 42.1 % (ref 39.0–52.0)
Hemoglobin: 13.6 g/dL (ref 13.0–17.0)
Immature Granulocytes: 0 %
Lymphocytes Relative: 14 %
Lymphs Abs: 1.1 10*3/uL (ref 0.7–4.0)
MCH: 28.2 pg (ref 26.0–34.0)
MCHC: 32.3 g/dL (ref 30.0–36.0)
MCV: 87.3 fL (ref 80.0–100.0)
Monocytes Absolute: 0.5 10*3/uL (ref 0.1–1.0)
Monocytes Relative: 6 %
Neutro Abs: 6.2 10*3/uL (ref 1.7–7.7)
Neutrophils Relative %: 79 %
Platelets: 179 10*3/uL (ref 150–400)
RBC: 4.82 MIL/uL (ref 4.22–5.81)
RDW: 13.3 % (ref 11.5–15.5)
WBC: 7.8 10*3/uL (ref 4.0–10.5)
nRBC: 0 % (ref 0.0–0.2)

## 2023-03-13 LAB — PROTIME-INR
INR: 1.1 (ref 0.8–1.2)
Prothrombin Time: 14.2 s (ref 11.4–15.2)

## 2023-03-13 SURGERY — ESOPHAGOGASTRODUODENOSCOPY (EGD) WITH PROPOFOL
Anesthesia: General

## 2023-03-13 MED ORDER — ROCURONIUM BROMIDE 10 MG/ML (PF) SYRINGE
PREFILLED_SYRINGE | INTRAVENOUS | Status: DC | PRN
  Administered 2023-03-13: 10 mg via INTRAVENOUS

## 2023-03-13 MED ORDER — SUCCINYLCHOLINE CHLORIDE 200 MG/10ML IV SOSY
PREFILLED_SYRINGE | INTRAVENOUS | Status: AC
  Filled 2023-03-13: qty 10

## 2023-03-13 MED ORDER — LACTATED RINGERS IV SOLN: INTRAVENOUS | Status: DC

## 2023-03-13 MED ORDER — PROPOFOL 10 MG/ML IV BOLUS
INTRAVENOUS | Status: DC | PRN
  Administered 2023-03-13: 160 mg via INTRAVENOUS
  Administered 2023-03-13: 60 mg via INTRAVENOUS

## 2023-03-13 MED ORDER — ONDANSETRON HCL 4 MG/2ML IJ SOLN
INTRAMUSCULAR | Status: AC
  Filled 2023-03-13: qty 2

## 2023-03-13 MED ORDER — FENTANYL CITRATE (PF) 100 MCG/2ML IJ SOLN
INTRAMUSCULAR | Status: DC | PRN
  Administered 2023-03-13 (×2): 50 ug via INTRAVENOUS

## 2023-03-13 MED ORDER — OMEPRAZOLE 40 MG PO CPDR: 40.0000 mg | DELAYED_RELEASE_CAPSULE | Freq: Two times a day (BID) | ORAL | 3 refills | Status: AC

## 2023-03-13 MED ORDER — DEXAMETHASONE SODIUM PHOSPHATE 4 MG/ML IJ SOLN
INTRAMUSCULAR | Status: DC | PRN
  Administered 2023-03-13: 6 mg via INTRAVENOUS

## 2023-03-13 MED ORDER — ASPIRIN 81 MG PO CHEW: 81.0000 mg | CHEWABLE_TABLET | Freq: Every day | ORAL | 11 refills | Status: AC

## 2023-03-13 MED ORDER — LIDOCAINE HCL (CARDIAC) PF 100 MG/5ML IV SOSY
PREFILLED_SYRINGE | INTRAVENOUS | Status: DC | PRN
  Administered 2023-03-13: 100 mg via INTRAVENOUS

## 2023-03-13 MED ORDER — ONDANSETRON HCL 4 MG/2ML IJ SOLN
INTRAMUSCULAR | Status: DC | PRN
  Administered 2023-03-13: 4 mg via INTRAVENOUS

## 2023-03-13 MED ORDER — DEXAMETHASONE SODIUM PHOSPHATE 4 MG/ML IJ SOLN
INTRAMUSCULAR | Status: AC
  Filled 2023-03-13: qty 1

## 2023-03-13 MED ORDER — GLUCAGON HCL RDNA (DIAGNOSTIC) 1 MG IJ SOLR
1.0000 mg | Freq: Once | INTRAMUSCULAR | Status: AC
  Administered 2023-03-13: 1 mg via INTRAVENOUS
  Filled 2023-03-13: qty 1

## 2023-03-13 MED ORDER — SUCCINYLCHOLINE CHLORIDE 200 MG/10ML IV SOSY
PREFILLED_SYRINGE | INTRAVENOUS | Status: DC | PRN
  Administered 2023-03-13: 100 mg via INTRAVENOUS

## 2023-03-13 MED ORDER — ROCURONIUM BROMIDE 10 MG/ML (PF) SYRINGE
PREFILLED_SYRINGE | INTRAVENOUS | Status: AC
  Filled 2023-03-13: qty 10

## 2023-03-13 MED ORDER — FENTANYL CITRATE (PF) 100 MCG/2ML IJ SOLN
INTRAMUSCULAR | Status: AC
  Filled 2023-03-13: qty 2

## 2023-03-13 NOTE — ED Triage Notes (Signed)
 Ate after 3 pm yesterday and has been unable to swallow pills or water since. Pt a/o. Ambulatory. Breathing wnl per pt and no ss of resp distress noted.

## 2023-03-13 NOTE — ED Notes (Signed)
 Pt called out and stated he "could not breathe" I talked to him and coached him into slowing his breathing down, he was panicking mildly placed patient on 2LNC. Patient is resting comfortably and will continue to monitor.

## 2023-03-13 NOTE — Op Note (Signed)
 [facility] Patient Name: Douglas Odonnell Procedure Date: 03/13/2023 12:28 PM MRN: [id number] Date of Birth: 23-Jan-1946 Attending MD: Deatrice Dine , MD, [id number] CSN: [id number] Age: 78 Admit Type: Outpatient Procedure:                Upper GI endoscopy Indications:              Dysphagia Providers:                Deatrice Dine, MD, Harlene Lips, Nidia Oak Referring MD:              Medicines:                Monitored Anesthesia Care Complications:            No immediate complications. Estimated Blood Loss:     Estimated blood loss was minimal. Procedure:                Pre-Anesthesia Assessment:                           - Prior to the procedure, a History and Physical                            was performed, and patient medications and                            allergies were reviewed. The patient's tolerance of                            previous anesthesia was also reviewed. The risks                            and benefits of the procedure and the sedation                            options and risks were discussed with the patient.                            All questions were answered, and informed consent                            was obtained. Prior Anticoagulants: The patient has                            taken no anticoagulant or antiplatelet agents. ASA                            Grade Assessment: III - A patient with severe                            systemic disease. After reviewing the risks and                            benefits,  the patient was deemed in satisfactory                            condition to undergo the procedure.                           After obtaining informed consent, the endoscope was                            passed under direct vision. Throughout the                            procedure, the patient's blood pressure, pulse, and                            oxygen saturations were monitored  continuously. The                            [id number]-[id number] ([id number]) scope was introduced through the                            mouth, and advanced to the second part of duodenum.                            The upper GI endoscopy was accomplished without                            difficulty. The patient tolerated the procedure                            well. Scope In: 1:37:14 PM Scope Out: 1:56:23 PM Total Procedure Duration: 0 hours 19 minutes 9 seconds  Findings:      Food was found in the lower third of the esophagus. Removal was       accomplished with a banding device and Roth net.      LA Grade B (one or more mucosal breaks greater than 5 mm, not extending       between the tops of two mucosal folds) esophagitis with no bleeding was       found in the lower third of the esophagus.      Diffuse erythematous mucosa without bleeding was found in the entire       examined stomach.      Many non-bleeding duodenal ulcers with a clean ulcer base (Forrest Class       III) were found in the duodenal bulb. Impression:               - Food in the lower third of the esophagus. Removal                            was successful.                           - LA Grade B esophagitis with no bleeding.                           -  Erythematous mucosa in the stomach.                           - Non-bleeding duodenal ulcers with a clean ulcer                            base (Forrest Class III). Moderate Sedation:      Per Anesthesia Care Recommendation:           - Patient has a contact number available for                            emergencies. The signs and symptoms of potential                            delayed complications were discussed with the                            patient. Return to normal activities tomorrow.                            Written discharge instructions were provided to the                            patient.                           - Continue present medications.                            - Repeat upper endoscopy to be scheduled with                            primary GI .                           - Return to GI clinic at appointment to be                            scheduled.                           -Patient to follow up with primary GI ( Dr Gordy Starch) and Advance endoscopist ( Dr West) for                            evaluation of repeat ablation given history of                            Barretts with HGD                           -PPI PO BID                           -  Stop using high dose aspirin  including Goody/BC                            powders, NSAIDs such as Aleve, ibuprofen, naproxen,                            Motrin, Voltaren or Advil (even the topical ones).                            Patient appears to be on Aspirin  325mg  for unknown                            indication                           -Aspiration precautions                           - Cut food in small pieces and chew food thoroughly                            to avoid regurgitation episodes.                           -Follow up with primary gastroenterologist ( Dr Gordy Starch) Procedure Code(s):        --- Professional ---                           [phone], Esophagogastroduodenoscopy, flexible,                            transoral; with removal of foreign body(s) Diagnosis Code(s):        --- Professional ---                           U81.871J, Food in esophagus causing other injury,                            initial encounter                           K20.90, Esophagitis, unspecified without bleeding                           K31.89, Other diseases of stomach and duodenum                           K26.9, Duodenal ulcer, unspecified as acute or                            chronic, without hemorrhage or perforation  R13.10, Dysphagia, unspecified CPT copyright 2022 American Medical  Association. All rights reserved. The codes documented in this report are preliminary and upon coder review may  be revised to meet current compliance requirements. Deatrice Dine, MD Deatrice Dine, MD 03/13/2023 2:13:01 PM This report has been signed electronically. Number of Addenda: 0

## 2023-03-13 NOTE — Discharge Instructions (Signed)
  Discharge instructions Please read the instructions outlined below and refer to this sheet in the next few weeks. These discharge instructions provide you with general information on caring for yourself after you leave the hospital. Your doctor may also give you specific instructions. While your treatment has been planned according to the most current medical practices available, unavoidable complications occasionally occur. If you have any problems or questions after discharge, please call your doctor. ACTIVITY You may resume your regular activity but move at a slower pace for the next 24 hours.  Take frequent rest periods for the next 24 hours.  Walking will help expel (get rid of) the air and reduce the bloated feeling in your abdomen.  No driving for 24 hours (because of the anesthesia (medicine) used during the test).  You may shower.  Do not sign any important legal documents or operate any machinery for 24 hours (because of the anesthesia used during the test).  NUTRITION Drink plenty of fluids.  You may resume your normal diet.  Begin with a light meal and progress to your normal diet.  Avoid alcoholic beverages for 24 hours or as instructed by your caregiver.  MEDICATIONS You may resume your normal medications unless your caregiver tells you otherwise.  WHAT YOU CAN EXPECT TODAY You may experience abdominal discomfort such as a feeling of fullness or "gas" pains.  FOLLOW-UP Your doctor will discuss the results of your test with you.  SEEK IMMEDIATE MEDICAL ATTENTION IF ANY OF THE FOLLOWING OCCUR: Excessive nausea (feeling sick to your stomach) and/or vomiting.  Severe abdominal pain and distention (swelling).  Trouble swallowing.  Temperature over 101 F (37.8 C).  Rectal bleeding or vomiting of blood.     Stop using high dose aspirin  including Goody/BC powders, NSAIDs such as Aleve, ibuprofen, naproxen, Motrin, Voltaren or Advil (even the topical ones). Patient appears to be  on Aspirin  325mg  for unknown indication  -Aspiration precautions  - Cut food in small pieces and chew food thoroughly to avoid regurgitation episodes.  -Follow up with primary gastroenterologist ( Dr Gordy Starch)       I hope you have a great rest of your week!   Douglas Odonnell Douglas Odonnell , M.D.. Gastroenterology and Hepatology [facility]

## 2023-03-13 NOTE — Consult Note (Signed)
 Danil Wedge Faizan Armin Yerger, M.D. Gastroenterology & Hepatology                                           Patient Name: Douglas Odonnell Account    MRN: [id number] Admission Date: 03/13/2023 Date of Evaluation:  03/13/2023 Time of Evaluation: 12:06 PM  Chief Complaint:  Dysphagia concerning for food bolus impaction   HPI:  This is a 78 y.o. male with history of distal esophageal adenocarcinoma treated endoscopically , Barrett's esophagus with high-grade dysplasia since 2016 had multiple sessions of Barrett's ablation by Dr. Mabel Larger at [facility] last performed 2020 , suggest repeat 3 months .without any follow-up since.  Patient previously followed by Dr. Gordy Starch as primary GI presenting with acute dysphagia concerning for food bolus impaction.  Patient reports unable to swallow liquid or solid food since yesterday previous last was eating chicken wing yesterday and fell got stuck in middle of the chest.  Since then patient has been choking and unable to tolerate oral secretions   Patient has extensive history of esophageal lesions .there was suspected and luminal narrowing in mid esophagus previously dilated  Previously seen by Dr Mabel Lynwood Larger at [facility] Upper endoscopy 2020 [facility] s/p Baretts ablation with APC     - Esophageal mucosal changes secondary to established                     Barrett's disease. Treated with argon plasma coagulation                     (APC). Biopsied.                     - Medium-sized hiatal hernia.                     - The examination was otherwise normal.   Suggested repeat was 3 months  2019  - LA Grade C esophagitis.                     - Esophageal mucosal changes consistent with Barrett's                     esophagus.                     - 3 cm hiatal hernia.                     - Non-bleeding erosive gastropathy.                     - Normal examined duodenum.                     - No specimens collected.   Past Medical History: SEE CHRONIC  ISSSUES: Past Medical History:  Diagnosis Date   Barrett esophagus    Chronic abdominal pain    Esophageal cancer (HCC)    GERD (gastroesophageal reflux disease)    H. pylori infection    Hiatal hernia    Hypertension    Renal calculus    Senile osteoporosis    Stroke [facility])    Past Surgical History:  Past Surgical History:  Procedure Laterality Date   BREATH TEK H PYLORI N/A 06/28/2014   Procedure: BREATH TEK H PYLORI;  Surgeon: Gordy CHRISTELLA Starch, MD;  Location: [facility];  Service: Gastroenterology;  Laterality: N/A;   CHOLECYSTECTOMY     COLONOSCOPY     TONSILLECTOMY     Family History:  Family History  Problem Relation Age of Onset   Diabetes Father    Heart disease Father    Prostate cancer Father    Diabetes Mother    Kidney failure Mother    Gout Mother    Kidney disease Mother    Diabetes Sister        twin   Stroke Maternal Grandfather    Social History:  Social History   Tobacco Use   Smoking status: Never   Smokeless tobacco: Never  Substance Use Topics   Alcohol use: No    Alcohol/week: 0.0 standard drinks of alcohol   Drug use: No    Home Medications:  Prior to Admission medications   Medication Sig Start Date End Date Taking? Authorizing Provider  amitriptyline  (ELAVIL ) 50 MG tablet TAKE ONE TABLET BY MOUTH ONCE DAILY AT BEDTIME (DISCONTINUE  AMITRIPTYLINE   25MG ) 08/04/15   Pyrtle, Gordy CHRISTELLA, MD  aspirin  325 MG tablet Take 1 tablet (325 mg total) by mouth daily. 05/05/17   Ogbata, Sylvester I, MD  clobetasol ointment (TEMOVATE) 0.05 %  05/07/17   [provider]  esomeprazole (NEXIUM) 40 MG capsule  02/07/14   [provider]  Linaclotide  (LINZESS ) 290 MCG CAPS capsule Take 1 capsule (290 mcg total) by mouth daily. 04/18/14   Pyrtle, Gordy CHRISTELLA, MD  lisinopril  (PRINIVIL ,ZESTRIL ) 10 MG tablet Take 30 mg by mouth.     [provider]  oxyCODONE  (ROXICODONE ) 15 MG immediate release tablet Take 1 tablet by mouth every 5 (five) hours as  needed. 04/22/17   [provider]  ranitidine (ZANTAC) 300 MG tablet Take 300 mg by mouth at bedtime.    [provider]  rOPINIRole (REQUIP) 0.5 MG tablet  05/19/17   [provider]  rosuvastatin  (CRESTOR ) 20 MG tablet Take 1 tablet (20 mg total) by mouth daily at 6 PM. 12/09/17   Whitfield Raisin, NP  tamsulosin  (FLOMAX ) 0.4 MG CAPS capsule Take 0.4 mg by mouth daily.    [provider]  topiramate  (TOPAMAX ) 25 MG tablet Take 1 tablet (25 mg total) by mouth 2 (two) times daily. 08/04/18   Whitfield Raisin, NP  Vitamin D , Ergocalciferol , 2000 units CAPS Take 1 capsule by mouth daily.    [provider]    Inpatient Medications: No current facility-administered medications for this encounter.  Current Outpatient Medications:    amitriptyline  (ELAVIL ) 50 MG tablet, TAKE ONE TABLET BY MOUTH ONCE DAILY AT BEDTIME (DISCONTINUE  AMITRIPTYLINE   25MG ), Disp: 30 tablet, Rfl: 1   aspirin  325 MG tablet, Take 1 tablet (325 mg total) by mouth daily., Disp: 30 tablet, Rfl: 0   clobetasol ointment (TEMOVATE) 0.05 %, , Disp: , Rfl:    esomeprazole (NEXIUM) 40 MG capsule, , Disp: , Rfl:    Linaclotide  (LINZESS ) 290 MCG CAPS capsule, Take 1 capsule (290 mcg total) by mouth daily., Disp: 30 capsule, Rfl: 1   lisinopril  (PRINIVIL ,ZESTRIL ) 10 MG tablet, Take 30 mg by mouth. , Disp: , Rfl:    oxyCODONE  (ROXICODONE ) 15 MG immediate release tablet, Take 1 tablet by mouth every 5 (five) hours as needed., Disp: , Rfl:    ranitidine (ZANTAC) 300 MG tablet, Take 300 mg by mouth at bedtime., Disp: , Rfl:    rOPINIRole (REQUIP) 0.5 MG tablet, ,  Disp: , Rfl:    rosuvastatin  (CRESTOR ) 20 MG tablet, Take 1 tablet (20 mg total) by mouth daily at 6 PM., Disp: 90 tablet, Rfl: 3   tamsulosin  (FLOMAX ) 0.4 MG CAPS capsule, Take 0.4 mg by mouth daily., Disp: , Rfl:    topiramate  (TOPAMAX ) 25 MG tablet, Take 1 tablet (25 mg total) by mouth 2 (two) times daily., Disp: 120 tablet, Rfl: 0    Vitamin D , Ergocalciferol , 2000 units CAPS, Take 1 capsule by mouth daily., Disp: , Rfl:  Allergies: Patient has no known allergies.  Complete Review of Systems: GENERAL: negative for malaise, night sweats HEENT: No changes in hearing or vision, no nose bleeds or other nasal problems. NECK: Negative for lumps, goiter, pain and significant neck swelling RESPIRATORY: Negative for cough, wheezing CARDIOVASCULAR: Negative for chest pain, leg swelling, palpitations, orthopnea GI: SEE HPI MUSCULOSKELETAL: Negative for joint pain or swelling, back pain, and muscle pain. SKIN: Negative for lesions, rash PSYCH: Negative for sleep disturbance, mood disorder and recent psychosocial stressors. HEMATOLOGY Negative for prolonged bleeding, bruising easily, and swollen nodes. ENDOCRINE: Negative for cold or heat intolerance, polyuria, polydipsia and goiter. NEURO: negative for tremor, gait imbalance, syncope and seizures. The remainder of the review of systems is noncontributory.  Physical Exam: BP (!) 164/134   Pulse 78   Temp 98.5 F (36.9 C) (Oral)   Resp 19   SpO2 98%  GENERAL: The patient is AO x3, in no acute distress. HEENT: Head is normocephalic and atraumatic. EOMI are intact. Mouth is well hydrated and without lesions. NECK: Supple. No masses LUNGS: Clear to auscultation. No presence of rhonchi/wheezing/rales. Adequate chest expansion HEART: RRR, normal s1 and s2. ABDOMEN: Soft, nontender, no guarding, no peritoneal signs, and nondistended. BS +. No masses.  Laboratory Data CBC:     Component Value Date/Time   WBC 7.8 03/13/2023 1024   RBC 4.82 03/13/2023 1024   HGB 13.6 03/13/2023 1024   HCT 42.1 03/13/2023 1024   PLT 179 03/13/2023 1024   MCV 87.3 03/13/2023 1024   MCH 28.2 03/13/2023 1024   MCHC 32.3 03/13/2023 1024   RDW 13.3 03/13/2023 1024   LYMPHSABS 1.1 03/13/2023 1024   MONOABS 0.5 03/13/2023 1024   EOSABS 0.0 03/13/2023 1024   BASOSABS 0.0 03/13/2023 1024   COAG:   Lab Results  Component Value Date   INR 1.1 03/13/2023   INR 1.00 05/02/2017    BMP:     Latest Ref Rng & Units 03/13/2023   10:24 AM 05/03/2017    4:21 AM 05/02/2017   12:55 PM  BMP  Glucose 70 - 99 mg/dL 893  889  896   BUN 8 - 23 mg/dL 19  14  15    Creatinine 0.61 - 1.24 mg/dL 8.94  8.97  8.99   Sodium 135 - 145 mmol/L 138  138  139   Potassium 3.5 - 5.1 mmol/L 3.8  4.1  4.0   Chloride 98 - 111 mmol/L 102  102  101   CO2 22 - 32 mmol/L 26  26    Calcium  8.9 - 10.3 mg/dL 9.2  9.2      HEPATIC:     Latest Ref Rng & Units 05/03/2017    4:21 AM 05/02/2017   12:39 PM 04/18/2014    9:40 AM  Hepatic Function  Total Protein 6.5 - 8.1 g/dL 6.6  7.4  6.9   Albumin 3.5 - 5.0 g/dL 3.8  4.3  4.4   AST 15 - 41  U/L 28  29  19    ALT 17 - 63 U/L 27  45  21   Alk Phosphatase 38 - 126 U/L 77  85  96   Total Bilirubin 0.3 - 1.2 mg/dL 0.8  0.4  0.4     CARDIAC: No results found for: CKTOTAL, CKMB, CKMBINDEX, TROPONINI   Imaging: I personally reviewed and interpreted the available imaging.  Assessment & Plan:  This is a 78 y.o. male with history of distal esophageal adenocarcinoma treated endoscopically , Barrett's esophagus with high-grade dysplasia since 2016 had multiple sessions of Barrett's ablation by Dr. Mabel Larger at [facility] last performed 2020 , suggest repeat 3 months .without any follow-up since.  Patient previously followed by Dr. Gordy Starch as primary GI presenting with acute dysphagia concerning for food bolus impaction.  #Acute Dysphagia  Patient likely has food bolus impaction with underlying significant esophageal history.  This could be stricture, progression of Barrett's into adenocarcinoma  Given severity of symptoms and elevated to tolerate oral secretions we will plan for upper endoscopy with removal of food bolus  Keep n.p.o. PPI IV Chest x-ray Upon discharge patient may need to follow-up with primary GI Dr. JINNY Starch and Dr Larger   Douglas Odonnell Faizan  Alanda Colton, MD Gastroenterology and Hepatology [facility]  This chart has been completed using [facility] Dictation software, and while attempts have been made to ensure accuracy , certain words and phrases may not be transcribed as intended

## 2023-03-13 NOTE — Transfer of Care (Addendum)
 Immediate Anesthesia Transfer of Care Note  Patient: Douglas Odonnell  Procedure(s) Performed: ESOPHAGOGASTRODUODENOSCOPY (EGD) WITH PROPOFOL  FOREIGN BODY REMOVAL  Patient Location: PACU  Anesthesia Type:General  Level of Consciousness: drowsy and patient cooperative  Airway & Oxygen Therapy: Patient Spontanous Breathing and Patient connected to nasal cannula oxygen  Post-op Assessment: Report given to RN and Post -op Vital signs reviewed and stable  Post vital signs: Reviewed and stable  Last Vitals:  Vitals Value Taken Time  BP 156/96 03/13/23 1409  Temp 98.1 03/12/22   1409  Pulse 88 03/13/23 1413  Resp 22 03/13/23 1411  SpO2 98 % 03/13/23 1413  Vitals shown include unfiled device data.  Last Pain:  Vitals:   03/13/23 1328  TempSrc:   PainSc: 9          Complications: No notable events documented.

## 2023-03-13 NOTE — Progress Notes (Signed)
 D/C instructions reviewed with [name], sister. Questions answered. Voiced understanding.

## 2023-03-13 NOTE — Interval H&P Note (Signed)
 History and Physical Interval Note:  03/13/2023 1:29 PM  Caitlin Hillmer  has presented today for surgery, with the diagnosis of food bolus impaction.  The various methods of treatment have been discussed with the patient and family. After consideration of risks, benefits and other options for treatment, the patient has consented to  Procedure(s): ESOPHAGOGASTRODUODENOSCOPY (EGD) WITH PROPOFOL  (N/A) as a surgical intervention.  The patient's history has been reviewed, patient examined, no change in status, stable for surgery.  I have reviewed the patient's chart and labs.  Questions were answered to the patient's satisfaction.    We will proceed with EGD as scheduled.    I thoroughly discussed with the patient the procedure, including the risks involved. Patient understands what the procedure involves including the benefits and any risks. Patient understands alternatives to the proposed procedure. Risks including (but not limited to) bleeding, tearing of the lining (perforation), rupture of adjacent organs, problems with heart and lung function, infection, and medication reactions. A small percentage of complications may require surgery, hospitalization, repeat endoscopic procedure, and/or transfusion.  Patient understood and agreed.     Deatrice FALCON Wagner Tanzi

## 2023-03-13 NOTE — H&P (View-Only) (Signed)
 Danil Wedge Faizan Armin Yerger, M.D. Gastroenterology & Hepatology                                           Patient Name: Douglas Odonnell Account    MRN: [id number] Admission Date: 03/13/2023 Date of Evaluation:  03/13/2023 Time of Evaluation: 12:06 PM  Chief Complaint:  Dysphagia concerning for food bolus impaction   HPI:  This is a 78 y.o. male with history of distal esophageal adenocarcinoma treated endoscopically , Barrett's esophagus with high-grade dysplasia since 2016 had multiple sessions of Barrett's ablation by Dr. Mabel Larger at [facility] last performed 2020 , suggest repeat 3 months .without any follow-up since.  Patient previously followed by Dr. Gordy Starch as primary GI presenting with acute dysphagia concerning for food bolus impaction.  Patient reports unable to swallow liquid or solid food since yesterday previous last was eating chicken wing yesterday and fell got stuck in middle of the chest.  Since then patient has been choking and unable to tolerate oral secretions   Patient has extensive history of esophageal lesions .there was suspected and luminal narrowing in mid esophagus previously dilated  Previously seen by Dr Mabel Lynwood Larger at [facility] Upper endoscopy 2020 [facility] s/p Baretts ablation with APC     - Esophageal mucosal changes secondary to established                     Barrett's disease. Treated with argon plasma coagulation                     (APC). Biopsied.                     - Medium-sized hiatal hernia.                     - The examination was otherwise normal.   Suggested repeat was 3 months  2019  - LA Grade C esophagitis.                     - Esophageal mucosal changes consistent with Barrett's                     esophagus.                     - 3 cm hiatal hernia.                     - Non-bleeding erosive gastropathy.                     - Normal examined duodenum.                     - No specimens collected.   Past Medical History: SEE CHRONIC  ISSSUES: Past Medical History:  Diagnosis Date   Barrett esophagus    Chronic abdominal pain    Esophageal cancer (HCC)    GERD (gastroesophageal reflux disease)    H. pylori infection    Hiatal hernia    Hypertension    Renal calculus    Senile osteoporosis    Stroke [facility])    Past Surgical History:  Past Surgical History:  Procedure Laterality Date   BREATH TEK H PYLORI N/A 06/28/2014   Procedure: BREATH TEK H PYLORI;  Surgeon: Gordy CHRISTELLA Starch, MD;  Location: [facility];  Service: Gastroenterology;  Laterality: N/A;   CHOLECYSTECTOMY     COLONOSCOPY     TONSILLECTOMY     Family History:  Family History  Problem Relation Age of Onset   Diabetes Father    Heart disease Father    Prostate cancer Father    Diabetes Mother    Kidney failure Mother    Gout Mother    Kidney disease Mother    Diabetes Sister        twin   Stroke Maternal Grandfather    Social History:  Social History   Tobacco Use   Smoking status: Never   Smokeless tobacco: Never  Substance Use Topics   Alcohol use: No    Alcohol/week: 0.0 standard drinks of alcohol   Drug use: No    Home Medications:  Prior to Admission medications   Medication Sig Start Date End Date Taking? Authorizing Provider  amitriptyline  (ELAVIL ) 50 MG tablet TAKE ONE TABLET BY MOUTH ONCE DAILY AT BEDTIME (DISCONTINUE  AMITRIPTYLINE   25MG ) 08/04/15   Pyrtle, Gordy CHRISTELLA, MD  aspirin  325 MG tablet Take 1 tablet (325 mg total) by mouth daily. 05/05/17   Ogbata, Sylvester I, MD  clobetasol ointment (TEMOVATE) 0.05 %  05/07/17   [provider]  esomeprazole (NEXIUM) 40 MG capsule  02/07/14   [provider]  Linaclotide  (LINZESS ) 290 MCG CAPS capsule Take 1 capsule (290 mcg total) by mouth daily. 04/18/14   Pyrtle, Gordy CHRISTELLA, MD  lisinopril  (PRINIVIL ,ZESTRIL ) 10 MG tablet Take 30 mg by mouth.     [provider]  oxyCODONE  (ROXICODONE ) 15 MG immediate release tablet Take 1 tablet by mouth every 5 (five) hours as  needed. 04/22/17   [provider]  ranitidine (ZANTAC) 300 MG tablet Take 300 mg by mouth at bedtime.    [provider]  rOPINIRole (REQUIP) 0.5 MG tablet  05/19/17   [provider]  rosuvastatin  (CRESTOR ) 20 MG tablet Take 1 tablet (20 mg total) by mouth daily at 6 PM. 12/09/17   Whitfield Raisin, NP  tamsulosin  (FLOMAX ) 0.4 MG CAPS capsule Take 0.4 mg by mouth daily.    [provider]  topiramate  (TOPAMAX ) 25 MG tablet Take 1 tablet (25 mg total) by mouth 2 (two) times daily. 08/04/18   Whitfield Raisin, NP  Vitamin D , Ergocalciferol , 2000 units CAPS Take 1 capsule by mouth daily.    [provider]    Inpatient Medications: No current facility-administered medications for this encounter.  Current Outpatient Medications:    amitriptyline  (ELAVIL ) 50 MG tablet, TAKE ONE TABLET BY MOUTH ONCE DAILY AT BEDTIME (DISCONTINUE  AMITRIPTYLINE   25MG ), Disp: 30 tablet, Rfl: 1   aspirin  325 MG tablet, Take 1 tablet (325 mg total) by mouth daily., Disp: 30 tablet, Rfl: 0   clobetasol ointment (TEMOVATE) 0.05 %, , Disp: , Rfl:    esomeprazole (NEXIUM) 40 MG capsule, , Disp: , Rfl:    Linaclotide  (LINZESS ) 290 MCG CAPS capsule, Take 1 capsule (290 mcg total) by mouth daily., Disp: 30 capsule, Rfl: 1   lisinopril  (PRINIVIL ,ZESTRIL ) 10 MG tablet, Take 30 mg by mouth. , Disp: , Rfl:    oxyCODONE  (ROXICODONE ) 15 MG immediate release tablet, Take 1 tablet by mouth every 5 (five) hours as needed., Disp: , Rfl:    ranitidine (ZANTAC) 300 MG tablet, Take 300 mg by mouth at bedtime., Disp: , Rfl:    rOPINIRole (REQUIP) 0.5 MG tablet, ,  Disp: , Rfl:    rosuvastatin  (CRESTOR ) 20 MG tablet, Take 1 tablet (20 mg total) by mouth daily at 6 PM., Disp: 90 tablet, Rfl: 3   tamsulosin  (FLOMAX ) 0.4 MG CAPS capsule, Take 0.4 mg by mouth daily., Disp: , Rfl:    topiramate  (TOPAMAX ) 25 MG tablet, Take 1 tablet (25 mg total) by mouth 2 (two) times daily., Disp: 120 tablet, Rfl: 0    Vitamin D , Ergocalciferol , 2000 units CAPS, Take 1 capsule by mouth daily., Disp: , Rfl:  Allergies: Patient has no known allergies.  Complete Review of Systems: GENERAL: negative for malaise, night sweats HEENT: No changes in hearing or vision, no nose bleeds or other nasal problems. NECK: Negative for lumps, goiter, pain and significant neck swelling RESPIRATORY: Negative for cough, wheezing CARDIOVASCULAR: Negative for chest pain, leg swelling, palpitations, orthopnea GI: SEE HPI MUSCULOSKELETAL: Negative for joint pain or swelling, back pain, and muscle pain. SKIN: Negative for lesions, rash PSYCH: Negative for sleep disturbance, mood disorder and recent psychosocial stressors. HEMATOLOGY Negative for prolonged bleeding, bruising easily, and swollen nodes. ENDOCRINE: Negative for cold or heat intolerance, polyuria, polydipsia and goiter. NEURO: negative for tremor, gait imbalance, syncope and seizures. The remainder of the review of systems is noncontributory.  Physical Exam: BP (!) 164/134   Pulse 78   Temp 98.5 F (36.9 C) (Oral)   Resp 19   SpO2 98%  GENERAL: The patient is AO x3, in no acute distress. HEENT: Head is normocephalic and atraumatic. EOMI are intact. Mouth is well hydrated and without lesions. NECK: Supple. No masses LUNGS: Clear to auscultation. No presence of rhonchi/wheezing/rales. Adequate chest expansion HEART: RRR, normal s1 and s2. ABDOMEN: Soft, nontender, no guarding, no peritoneal signs, and nondistended. BS +. No masses.  Laboratory Data CBC:     Component Value Date/Time   WBC 7.8 03/13/2023 1024   RBC 4.82 03/13/2023 1024   HGB 13.6 03/13/2023 1024   HCT 42.1 03/13/2023 1024   PLT 179 03/13/2023 1024   MCV 87.3 03/13/2023 1024   MCH 28.2 03/13/2023 1024   MCHC 32.3 03/13/2023 1024   RDW 13.3 03/13/2023 1024   LYMPHSABS 1.1 03/13/2023 1024   MONOABS 0.5 03/13/2023 1024   EOSABS 0.0 03/13/2023 1024   BASOSABS 0.0 03/13/2023 1024   COAG:   Lab Results  Component Value Date   INR 1.1 03/13/2023   INR 1.00 05/02/2017    BMP:     Latest Ref Rng & Units 03/13/2023   10:24 AM 05/03/2017    4:21 AM 05/02/2017   12:55 PM  BMP  Glucose 70 - 99 mg/dL 893  889  896   BUN 8 - 23 mg/dL 19  14  15    Creatinine 0.61 - 1.24 mg/dL 8.94  8.97  8.99   Sodium 135 - 145 mmol/L 138  138  139   Potassium 3.5 - 5.1 mmol/L 3.8  4.1  4.0   Chloride 98 - 111 mmol/L 102  102  101   CO2 22 - 32 mmol/L 26  26    Calcium  8.9 - 10.3 mg/dL 9.2  9.2      HEPATIC:     Latest Ref Rng & Units 05/03/2017    4:21 AM 05/02/2017   12:39 PM 04/18/2014    9:40 AM  Hepatic Function  Total Protein 6.5 - 8.1 g/dL 6.6  7.4  6.9   Albumin 3.5 - 5.0 g/dL 3.8  4.3  4.4   AST 15 - 41  U/L 28  29  19    ALT 17 - 63 U/L 27  45  21   Alk Phosphatase 38 - 126 U/L 77  85  96   Total Bilirubin 0.3 - 1.2 mg/dL 0.8  0.4  0.4     CARDIAC: No results found for: CKTOTAL, CKMB, CKMBINDEX, TROPONINI   Imaging: I personally reviewed and interpreted the available imaging.  Assessment & Plan:  This is a 78 y.o. male with history of distal esophageal adenocarcinoma treated endoscopically , Barrett's esophagus with high-grade dysplasia since 2016 had multiple sessions of Barrett's ablation by Dr. Mabel Larger at [facility] last performed 2020 , suggest repeat 3 months .without any follow-up since.  Patient previously followed by Dr. Gordy Starch as primary GI presenting with acute dysphagia concerning for food bolus impaction.  #Acute Dysphagia  Patient likely has food bolus impaction with underlying significant esophageal history.  This could be stricture, progression of Barrett's into adenocarcinoma  Given severity of symptoms and elevated to tolerate oral secretions we will plan for upper endoscopy with removal of food bolus  Keep n.p.o. PPI IV Chest x-ray Upon discharge patient may need to follow-up with primary GI Dr. JINNY Starch and Dr Larger   Marisol Glazer Faizan  Alanda Colton, MD Gastroenterology and Hepatology [facility]  This chart has been completed using [facility] Dictation software, and while attempts have been made to ensure accuracy , certain words and phrases may not be transcribed as intended

## 2023-03-13 NOTE — Telephone Encounter (Signed)
-----   Message from Franky Macho sent at 03/13/2023  2:06 PM EST ----- Regarding: referral Hi [name]  Can this patient be referred back to Dr Erick Blinks as he was Douglas Odonnell patient

## 2023-03-13 NOTE — Telephone Encounter (Signed)
 Referral sent, they will contact patient with apt

## 2023-03-13 NOTE — Anesthesia Procedure Notes (Signed)
 Procedure Name: Intubation Date/Time: 03/13/2023 1:34 PM  Performed by: Para Jerelene CROME, CRNAPre-anesthesia Checklist: Patient identified, Emergency Drugs available, Suction available and Patient being monitored Patient Re-evaluated:Patient Re-evaluated prior to induction Oxygen Delivery Method: Circle system utilized Preoxygenation: Pre-oxygenation with 100% oxygen Induction Type: Rapid sequence and IV induction Laryngoscope Size: Mac and 4 Grade View: Grade II Tube type: Oral Tube size: 7.0 mm Number of attempts: 1 Airway Equipment and Method: Stylet Placement Confirmation: ETT inserted through vocal cords under direct vision, positive ETCO2, CO2 detector and breath sounds checked- equal and bilateral Secured at: 22 cm Tube secured with: Tape Dental Injury: Teeth and Oropharynx as per pre-operative assessment  Comments: Patient preoxygenated prior to procedure. No ventilation during induction. RSI. Atraumatic intubation. Edentulous. Lips remain in preoperative condition.

## 2023-03-13 NOTE — Anesthesia Preprocedure Evaluation (Signed)
 Anesthesia Evaluation  Patient identified by MRN, date of birth, ID band Patient awake    Reviewed: Allergy & Precautions, H&P , NPO status , Patient's Chart, lab work & pertinent test results, reviewed documented beta blocker date and time   Airway Mallampati: II  TM Distance: >3 FB Neck ROM: full    Dental no notable dental hx.    Pulmonary neg pulmonary ROS   Pulmonary exam normal breath sounds clear to auscultation       Cardiovascular Exercise Tolerance: Good hypertension,  Rhythm:regular Rate:Normal     Neuro/Psych  Neuromuscular disease CVA  negative psych ROS   GI/Hepatic Neg liver ROS, hiatal hernia,GERD  ,,  Endo/Other  negative endocrine ROS    Renal/GU Renal disease  negative genitourinary   Musculoskeletal   Abdominal   Peds  Hematology negative hematology ROS (+)   Anesthesia Other Findings   Reproductive/Obstetrics negative OB ROS                             Anesthesia Physical Anesthesia Plan  ASA: 3  Anesthesia Plan: General and General ETT   Post-op Pain Management:    Induction:   PONV Risk Score and Plan: Ondansetron   Airway Management Planned:   Additional Equipment:   Intra-op Plan:   Post-operative Plan:   Informed Consent: I have reviewed the patients History and Physical, chart, labs and discussed the procedure including the risks, benefits and alternatives for the proposed anesthesia with the patient or authorized representative who has indicated his/her understanding and acceptance.     Dental Advisory Given  Plan Discussed with: CRNA  Anesthesia Plan Comments:        Anesthesia Quick Evaluation

## 2023-03-13 NOTE — Progress Notes (Signed)
 Awake. Denies pain. Swallowing without difficulty. Assisted with dressing. D/C to home in good condition.

## 2023-03-13 NOTE — ED Provider Notes (Signed)
 [facility] EMERGENCY DEPARTMENT AT [facility] Provider Note   CSN: [id number] Arrival date & time: 03/13/23  9057     History  Chief Complaint  Patient presents with   Food bolus    Douglas Odonnell is a 78 y.o. male.  He is presenting with complaint of unable to swallow liquids or food since yesterday.  He said he was eating a chicken wing yesterday and felt like it got stuck.  He has not been able to swallow anything since his pills around 3 PM.  He said he is choking.  No difficulty speaking or breathing.  He said things have gotten stuck before but they have always passed.  He denies ever having had an endoscopy.  The history is provided by the patient.       Home Medications Prior to Admission medications   Medication Sig Start Date End Date Taking? Authorizing Provider  amitriptyline  (ELAVIL ) 50 MG tablet TAKE ONE TABLET BY MOUTH ONCE DAILY AT BEDTIME (DISCONTINUE  AMITRIPTYLINE   25MG ) 08/04/15   Pyrtle, Gordy HERO, MD  aspirin  325 MG tablet Take 1 tablet (325 mg total) by mouth daily. 05/05/17   Ogbata, Sylvester I, MD  clobetasol ointment (TEMOVATE) 0.05 %  05/07/17   [provider]  esomeprazole (NEXIUM) 40 MG capsule  02/07/14   [provider]  Linaclotide  (LINZESS ) 290 MCG CAPS capsule Take 1 capsule (290 mcg total) by mouth daily. 04/18/14   Pyrtle, Gordy HERO, MD  lisinopril  (PRINIVIL ,ZESTRIL ) 10 MG tablet Take 30 mg by mouth.     [provider]  oxyCODONE  (ROXICODONE ) 15 MG immediate release tablet Take 1 tablet by mouth every 5 (five) hours as needed. 04/22/17   [provider]  ranitidine (ZANTAC) 300 MG tablet Take 300 mg by mouth at bedtime.    [provider]  rOPINIRole (REQUIP) 0.5 MG tablet  05/19/17   [provider]  rosuvastatin  (CRESTOR ) 20 MG tablet Take 1 tablet (20 mg total) by mouth daily at 6 PM. 12/09/17   Whitfield Raisin, NP  tamsulosin  (FLOMAX ) 0.4 MG CAPS capsule Take 0.4 mg by mouth daily.     [provider]  topiramate  (TOPAMAX ) 25 MG tablet Take 1 tablet (25 mg total) by mouth 2 (two) times daily. 08/04/18   Whitfield Raisin, NP  Vitamin D , Ergocalciferol , 2000 units CAPS Take 1 capsule by mouth daily.    [provider]      Allergies    Patient has no known allergies.    Review of Systems   Review of Systems  Constitutional:  Negative for fever.  HENT:  Positive for trouble swallowing.   Respiratory:  Negative for shortness of breath.   Cardiovascular:  Negative for chest pain.  Gastrointestinal:  Positive for nausea and vomiting. Negative for abdominal pain.    Physical Exam Updated Vital Signs BP (!) 163/78 (BP Location: Right Arm)   Pulse 78   Temp 98.5 F (36.9 C) (Oral)   Resp 17   SpO2 99%  Physical Exam Vitals and nursing note reviewed.  Constitutional:      General: He is not in acute distress.    Appearance: Normal appearance. He is well-developed.  HENT:     Head: Normocephalic and atraumatic.  Eyes:     Conjunctiva/sclera: Conjunctivae normal.  Cardiovascular:     Rate and Rhythm: Normal rate and regular rhythm.     Heart sounds: No murmur heard. Pulmonary:     Effort: Pulmonary effort is  normal. No respiratory distress.     Breath sounds: Normal breath sounds.  Abdominal:     Palpations: Abdomen is soft.     Tenderness: There is no abdominal tenderness. There is no guarding or rebound.  Musculoskeletal:        General: No swelling.     Cervical back: Neck supple.  Skin:    General: Skin is warm and dry.     Capillary Refill: Capillary refill takes less than 2 seconds.  Neurological:     General: No focal deficit present.     Mental Status: He is alert.     Gait: Gait normal.     ED Results / Procedures / Treatments   Labs (all labs ordered are listed, but only abnormal results are displayed) Labs Reviewed  BASIC METABOLIC PANEL - Abnormal; Notable for the following components:      Result Value   Glucose, Bld 106  (*)    All other components within normal limits  CBC WITH DIFFERENTIAL/PLATELET  PROTIME-INR    EKG None  Radiology DG Chest Port 1 View Result Date: 03/13/2023 CLINICAL DATA:  Chest pain. EXAM: PORTABLE CHEST 1 VIEW COMPARISON:  05/02/2017. FINDINGS: Bilateral lung fields are clear. Bilateral costophrenic angles are clear. Mildly enlarged cardio-mediastinal silhouette, which may be accentuated by AP technique. No acute osseous abnormalities. The soft tissues are within normal limits. There are surgical clips in the right upper quadrant, typical of a previous cholecystectomy. IMPRESSION: No active disease. Electronically Signed   By: Ree Molt M.D.   On: 03/13/2023 13:36    Procedures Procedures    Medications Ordered in ED Medications  glucagon  (human recombinant) (GLUCAGEN ) injection 1 mg (has no administration in time range)    ED Course/ Medical Decision Making/ A&P Clinical Course as of 03/13/23 2058  Thu Mar 13, 2023  1054 Patient got glucagon  with no improvement in his symptoms.  Will page GI for further recommendations. [MB]  1113 Discussed with Dr. Cinderella GI who will set the patient up for endoscopy. [MB]  1138 Patient neglected to mention that he has had multiple endoscopies in the past with his history of esophageal cancer.  He said he was followed at [facility] but has not seen them in over 5 years. [MB]    Clinical Course User Index [MB] Towana Ozell BROCKS, MD                                 Medical Decision Making Amount and/or Complexity of Data Reviewed Labs: ordered. Radiology: ordered.  Risk Prescription drug management.   This patient complains of stuffed chicken in his throat; this involves an extensive number of treatment Options and is a complaint that carries with it a high risk of complications and morbidity. The differential includes esophageal food bolus, esophagitis, gastritis, esophageal tear  I ordered, reviewed and interpreted labs, which  included CBC normal chemistries normal I ordered medication IV glucagon  and reviewed PMP when indicated. I ordered imaging studies which included chest x-ray and I independently    visualized and interpreted imaging which showed no acute findings Additional history obtained from patient's wife Previous records obtained and reviewed in epic, patient has a prior history of esophageal cancer and multiple endoscopies I consulted Dr. Cinderella and discussed lab and imaging findings and discussed disposition.  Cardiac monitoring reviewed, sinus rhythm Social determinants considered, no significant barriers Critical Interventions: None  After the interventions stated  above, I reevaluated the patient and found patient still to be having the food sensation and unable to swallow liquids Admission and further testing considered, patient will be taken to endoscopy suite for further evaluation.  Anticipate discharge if able to relieve obstruction.         Final Clinical Impression(s) / ED Diagnoses Final diagnoses:  Esophageal obstruction due to food impaction    Rx / DC Orders ED Discharge Orders     None         Towana Ozell BROCKS, MD 03/13/23 2101

## 2023-03-14 NOTE — Anesthesia Postprocedure Evaluation (Signed)
 Anesthesia Post Note  Patient: Douglas Odonnell  Procedure(s) Performed: ESOPHAGOGASTRODUODENOSCOPY (EGD) WITH PROPOFOL  FOREIGN BODY REMOVAL  Patient location during evaluation: Phase II Anesthesia Type: General Level of consciousness: awake Pain management: pain level controlled Vital Signs Assessment: post-procedure vital signs reviewed and stable Respiratory status: spontaneous breathing and respiratory function stable Cardiovascular status: blood pressure returned to baseline and stable Postop Assessment: no headache and no apparent nausea or vomiting Anesthetic complications: no Comments: Late entry   No notable events documented.   Last Vitals:  Vitals:   03/13/23 1445 03/13/23 1500  BP: (!) 156/78 (!) 161/78  Pulse: 71 72  Resp: 16 16  Temp:    SpO2: 97% 99%    Last Pain:  Vitals:   03/13/23 1500  TempSrc:   PainSc: 0-No pain                 Yvonna JINNY Bosworth

## 2023-03-21 ENCOUNTER — Encounter (HOSPITAL_COMMUNITY): Payer: Self-pay | Admitting: Gastroenterology

## 2023-08-26 ENCOUNTER — Other Ambulatory Visit (INDEPENDENT_AMBULATORY_CARE_PROVIDER_SITE_OTHER): Payer: Self-pay | Admitting: Gastroenterology

## 2023-08-29 ENCOUNTER — Other Ambulatory Visit (INDEPENDENT_AMBULATORY_CARE_PROVIDER_SITE_OTHER): Payer: Self-pay | Admitting: Gastroenterology
